# Patient Record
Sex: Female | Born: 1985 | Race: Black or African American | Hispanic: No | Marital: Single | Smoking: Current every day smoker
Health system: Southern US, Community
[De-identification: ages and names within clinical notes are randomized; demographics above are authoritative.]

## PROBLEM LIST (undated history)

## (undated) DIAGNOSIS — Z789 Other specified health status: Secondary | ICD-10-CM

## (undated) HISTORY — DX: Other specified health status: Z78.9

## (undated) HISTORY — PX: TUBAL LIGATION: SHX77

---
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
## 2010-06-22 ENCOUNTER — Ambulatory Visit: Payer: Self-pay | Admitting: Family Medicine

## 2010-10-17 ENCOUNTER — Observation Stay: Payer: Self-pay | Admitting: Obstetrics and Gynecology

## 2010-10-20 ENCOUNTER — Inpatient Hospital Stay: Payer: Self-pay | Admitting: Obstetrics and Gynecology

## 2013-04-07 ENCOUNTER — Emergency Department: Payer: Self-pay | Admitting: Internal Medicine

## 2013-04-07 LAB — URINALYSIS, COMPLETE
Bacteria: NONE SEEN
Bilirubin,UR: NEGATIVE
Ketone: NEGATIVE
Ph: 7 (ref 4.5–8.0)
Protein: NEGATIVE
RBC,UR: 9 /HPF (ref 0–5)
Specific Gravity: 1.018 (ref 1.003–1.030)
Squamous Epithelial: 18
WBC UR: 5 /HPF (ref 0–5)

## 2013-04-07 LAB — COMPREHENSIVE METABOLIC PANEL
Alkaline Phosphatase: 54 U/L (ref 50–136)
Anion Gap: 8 (ref 7–16)
BUN: 3 mg/dL — ABNORMAL LOW (ref 7–18)
Chloride: 105 mmol/L (ref 98–107)
Creatinine: 0.37 mg/dL — ABNORMAL LOW (ref 0.60–1.30)
EGFR (African American): >60
SGOT(AST): 27 U/L (ref 15–37)
SGPT (ALT): 25 U/L (ref 12–78)
Sodium: 137 mmol/L (ref 136–145)

## 2013-04-07 LAB — CBC
HCT: 30.1 % — ABNORMAL LOW (ref 35.0–47.0)
MCH: 26.8 pg (ref 26.0–34.0)
MCV: 81 fL (ref 80–100)
Platelet: 200 10*3/uL (ref 150–440)
RBC: 3.71 10*6/uL — ABNORMAL LOW (ref 3.80–5.20)
RDW: 15.2 % — ABNORMAL HIGH (ref 11.5–14.5)
WBC: 8.9 10*3/uL (ref 3.6–11.0)

## 2013-04-07 LAB — LIPASE, BLOOD: Lipase: 50 U/L — ABNORMAL LOW (ref 73–393)

## 2013-04-07 LAB — HCG, QUANTITATIVE, PREGNANCY: Beta Hcg, Quant.: 58762 m[IU]/mL — ABNORMAL HIGH

## 2013-07-18 ENCOUNTER — Ambulatory Visit: Payer: Self-pay | Admitting: Family Medicine

## 2013-08-15 ENCOUNTER — Observation Stay: Payer: Self-pay | Admitting: Obstetrics and Gynecology

## 2013-08-15 LAB — URINALYSIS, COMPLETE
Bilirubin,UR: NEGATIVE
Blood: NEGATIVE
Ketone: NEGATIVE
Nitrite: NEGATIVE
Ph: 6 (ref 4.5–8.0)
Protein: 30
RBC,UR: 2 /HPF (ref 0–5)
Specific Gravity: 1.021 (ref 1.003–1.030)
WBC UR: 6 /HPF (ref 0–5)

## 2013-09-27 ENCOUNTER — Ambulatory Visit: Payer: Self-pay | Admitting: Obstetrics and Gynecology

## 2013-09-27 LAB — CBC WITH DIFFERENTIAL/PLATELET
Basophil #: 0.1 10*3/uL (ref 0.0–0.1)
Eosinophil %: 1 %
HCT: 31.3 % — ABNORMAL LOW (ref 35.0–47.0)
HGB: 10.7 g/dL — ABNORMAL LOW (ref 12.0–16.0)
MCHC: 34.3 g/dL (ref 32.0–36.0)
Monocyte %: 6 %
Neutrophil #: 5 10*3/uL (ref 1.4–6.5)
Platelet: 209 10*3/uL (ref 150–440)
RDW: 15 % — ABNORMAL HIGH (ref 11.5–14.5)

## 2013-09-30 ENCOUNTER — Inpatient Hospital Stay: Payer: Self-pay | Admitting: Obstetrics and Gynecology

## 2013-10-01 LAB — PATHOLOGY REPORT

## 2013-10-01 LAB — HEMATOCRIT: HCT: 26.5 % — ABNORMAL LOW (ref 35.0–47.0)

## 2014-02-11 IMAGING — US US OB < 14 WEEKS
1 series · 14 of 28 positions shown · non-contrast
Comparison: none

REASON FOR EXAM: pelvic pain pregnant
COMMENTS:

PROCEDURE:     US  - US OB LESS THAN 14 WEEKS  - April 07, 2013  [DATE]
RESULT:     Comparison: None
TECHNIQUE: Multiple transabdominal gray-scale images and endovaginal
gray-scale images of the pelvis performed.

[Series 1: us ob < 14 weeks · 0.30mm/px · 14 of 35 slices shown]
[im 2/35]
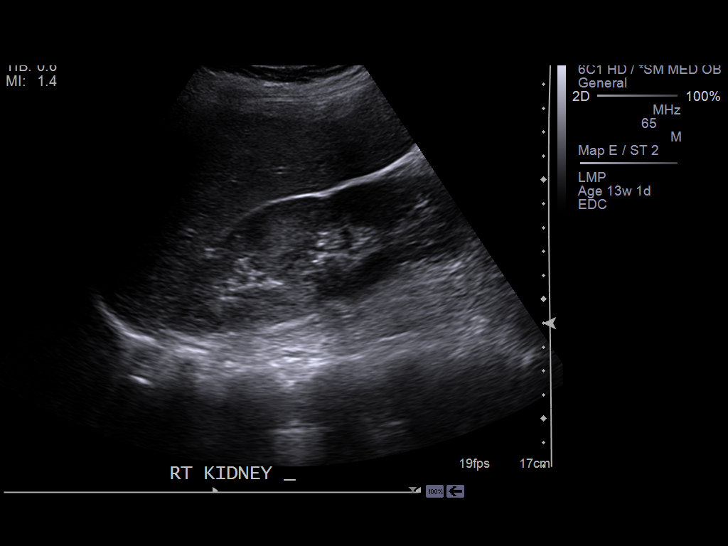
[im 4/35]
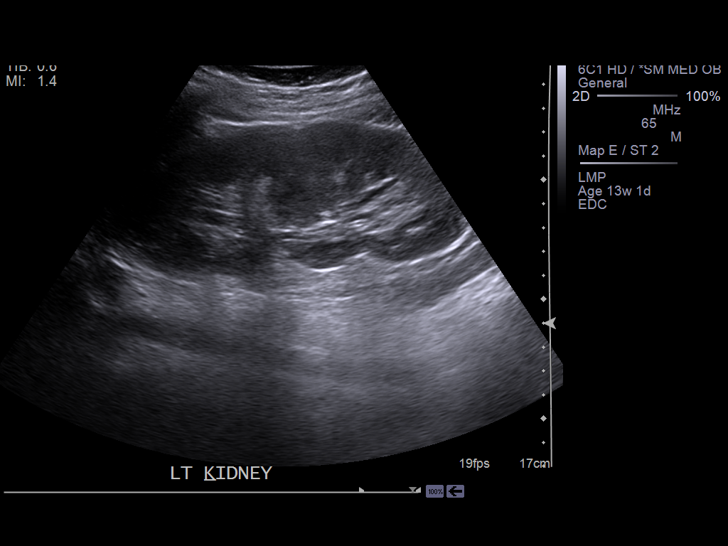
[im 7/35]
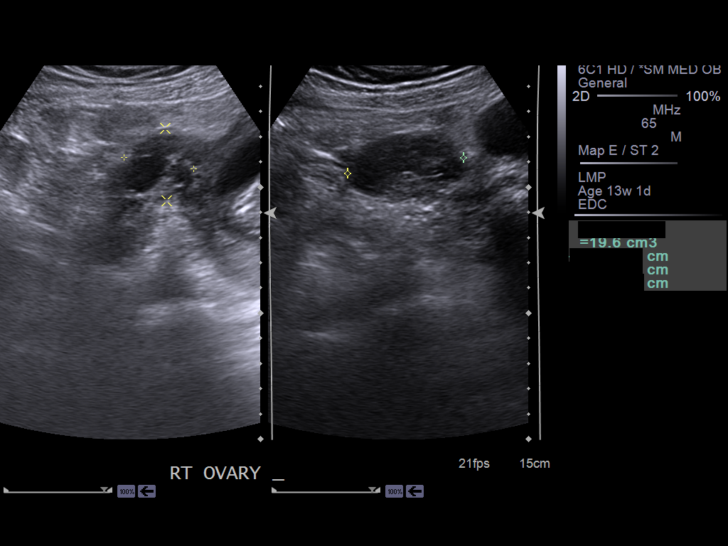
[im 9/35]
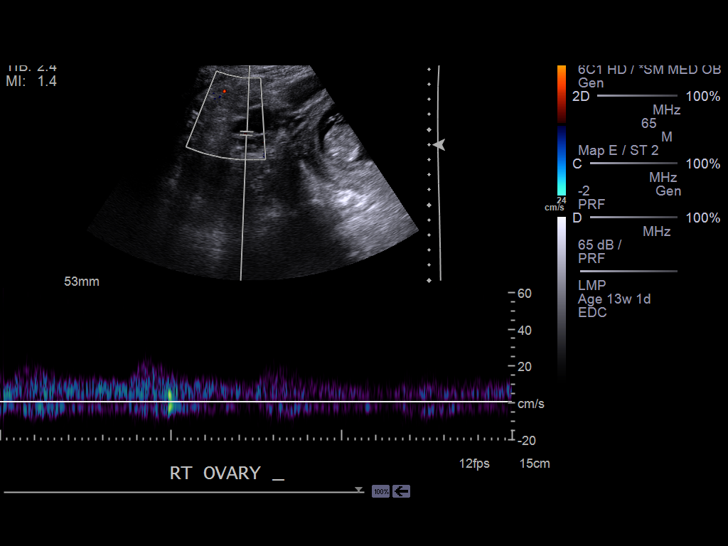
[im 12/35]
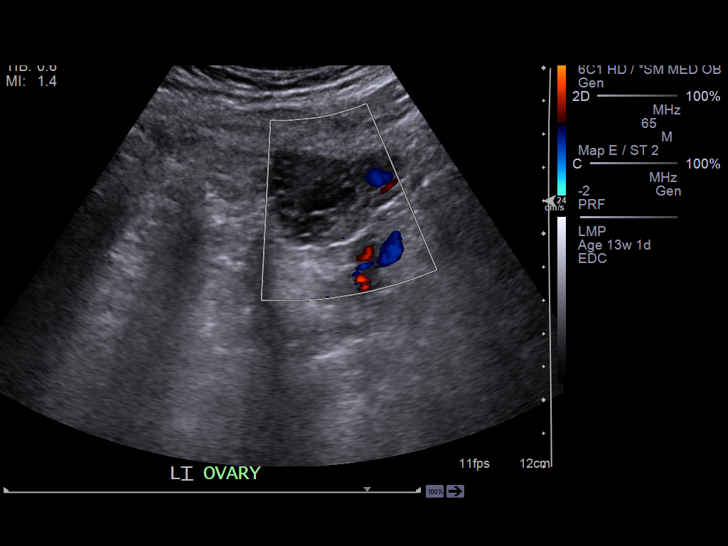
[im 14/35]
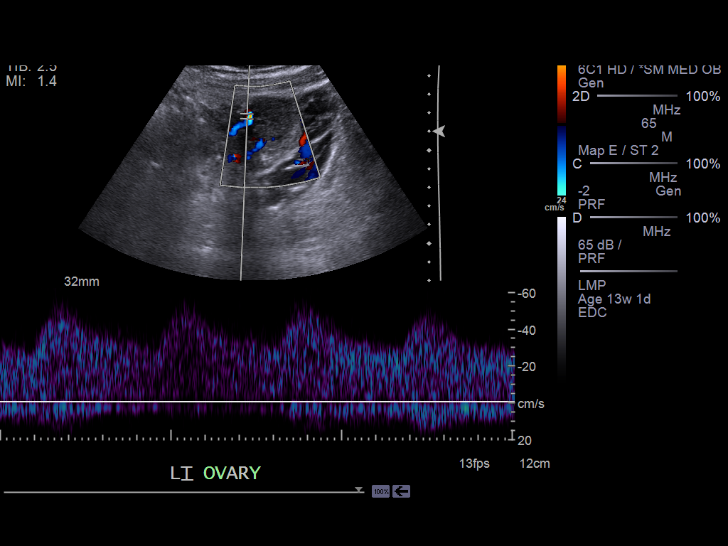
[im 17/35]
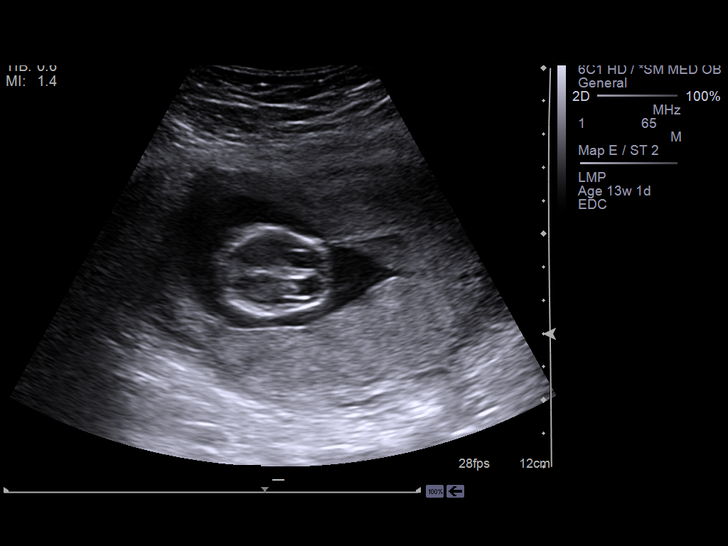
[im 19/35]
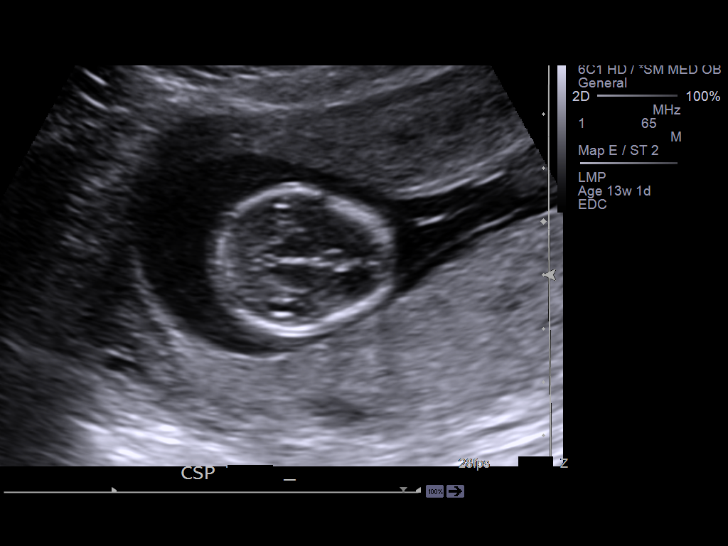
[im 22/35]
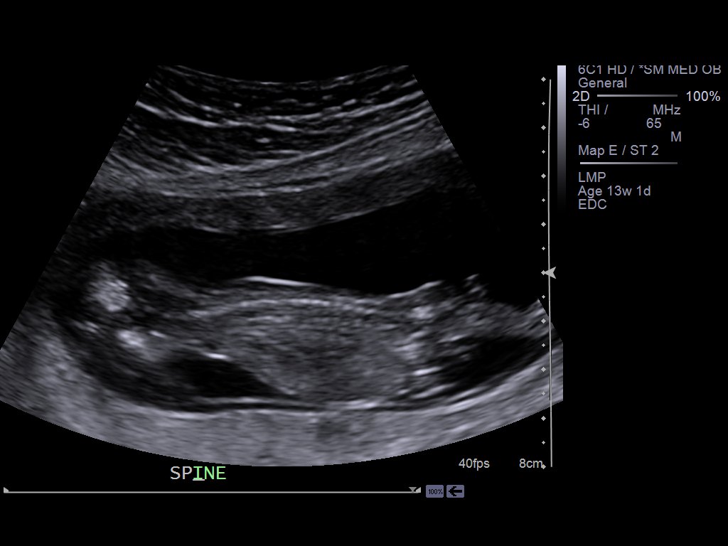
[im 24/35]
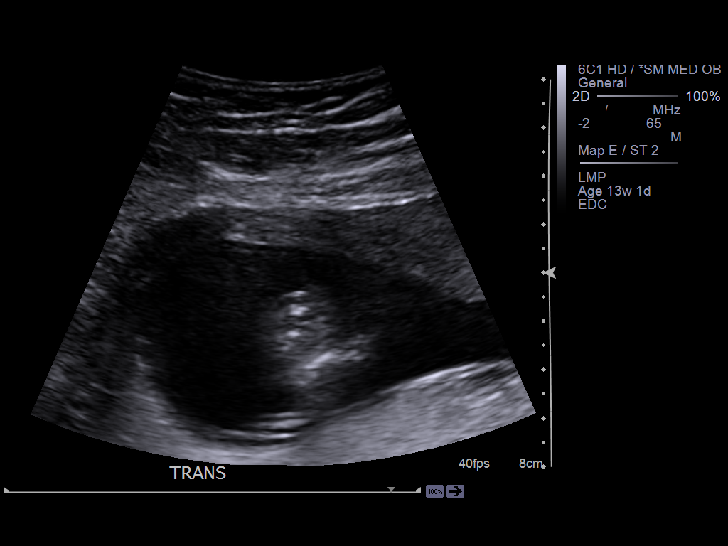
[im 27/35]
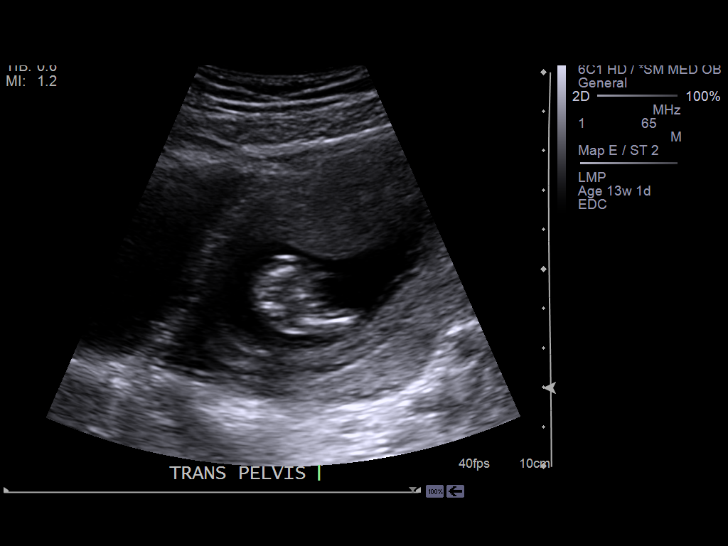
[im 29/35]
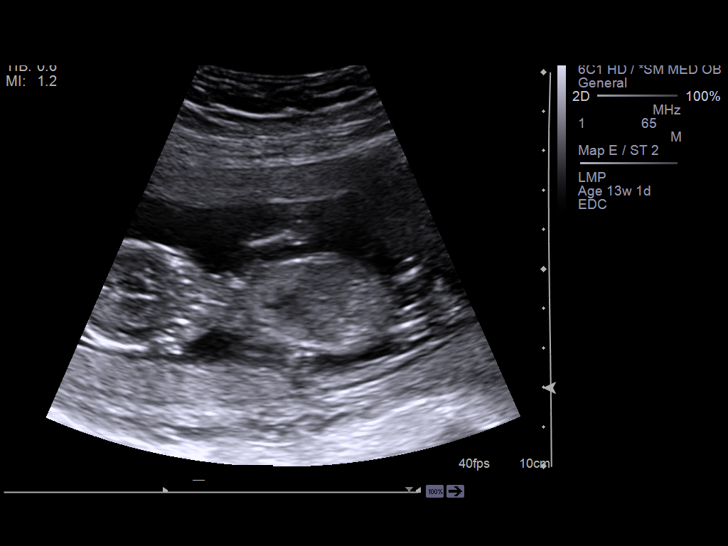
[im 32/35]
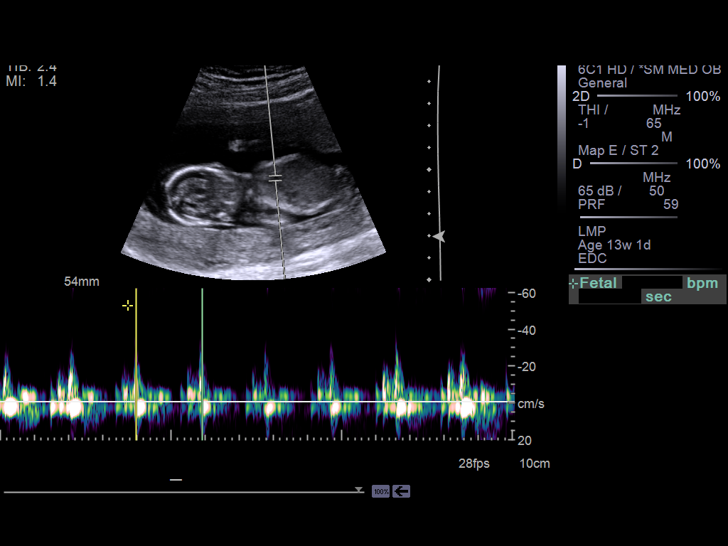
[im 35/35]
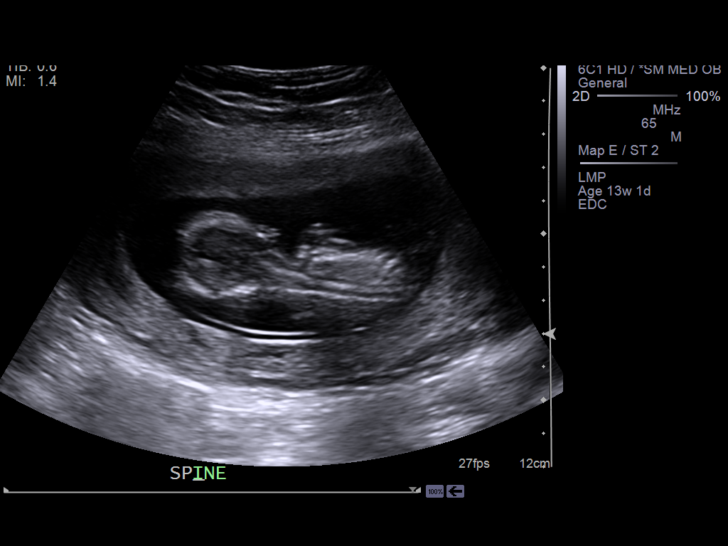

[14 of 28 positions shown; findings below may reference images not displayed]

FINDINGS: There is a single live intrauterine pregnancy visualized with a crown-rump
length of 8.14 cm dating the pregnancy at 14 weeks 1 days. There is a normal
yolk sac. There is a normal fetal heart rate of 155 beats per minute.

The right ovary measures 2.8 x 2.9 x 4.6 cm.  The left ovary measures 3.8 x
2.7 x 3.6 cm.  There is no adnexal mass.

There is no pelvic free fluid.
IMPRESSION: Single live intrauterine pregnancy dating 14 weeks 1 days with an estimated
due date of 10/03/2013.

[REDACTED]

## 2014-05-24 IMAGING — US US OB US >=[ID] SNGL FETUS
1 series · 13 of 28 positions shown · non-contrast
Comparison: none

REASON FOR EXAM: Low risk anatomy
COMMENTS:

[Series 1: us ob us >=(id) sngl fetus · 0.23mm/px · 13 of 111 slices shown]
[im 5/111]
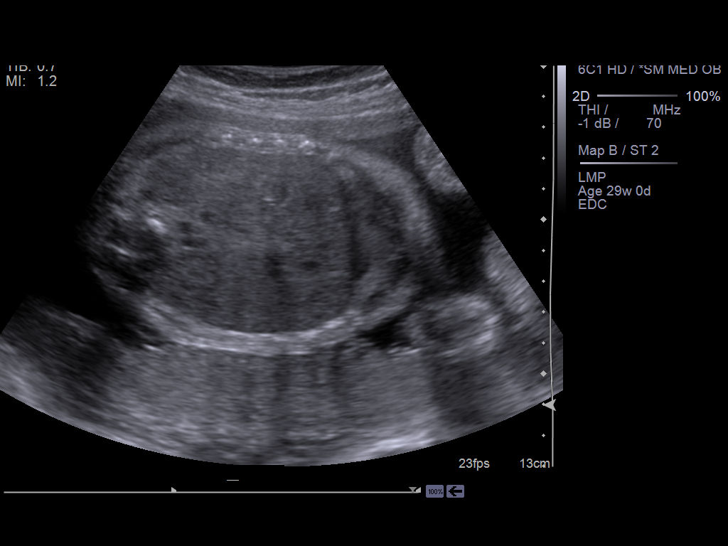
[im 13/111]
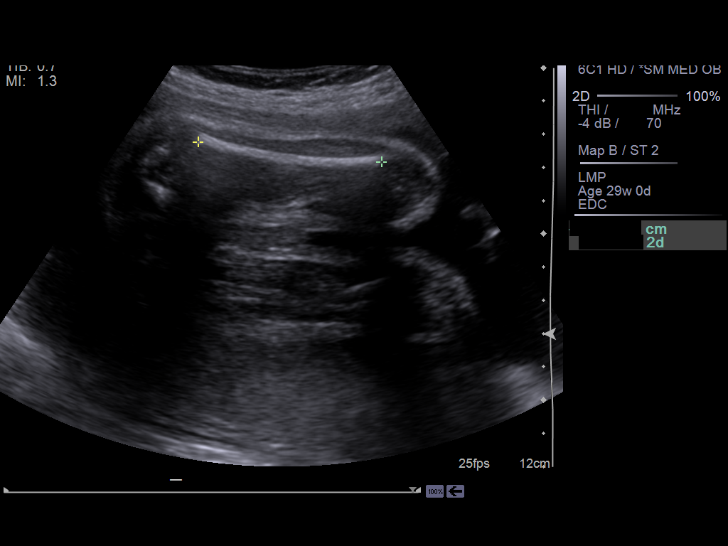
[im 21/111]
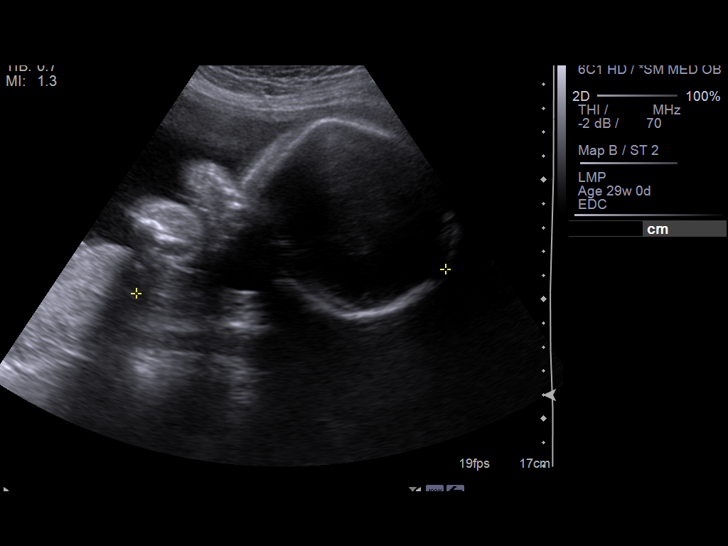
[im 29/111]
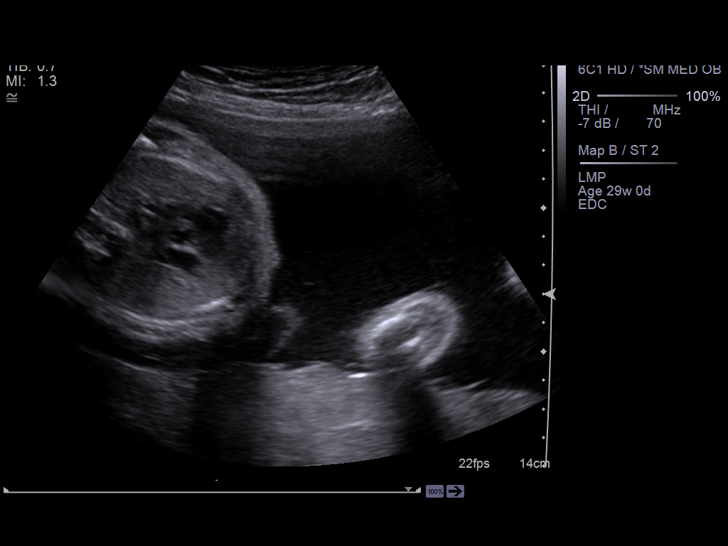
[im 37/111]
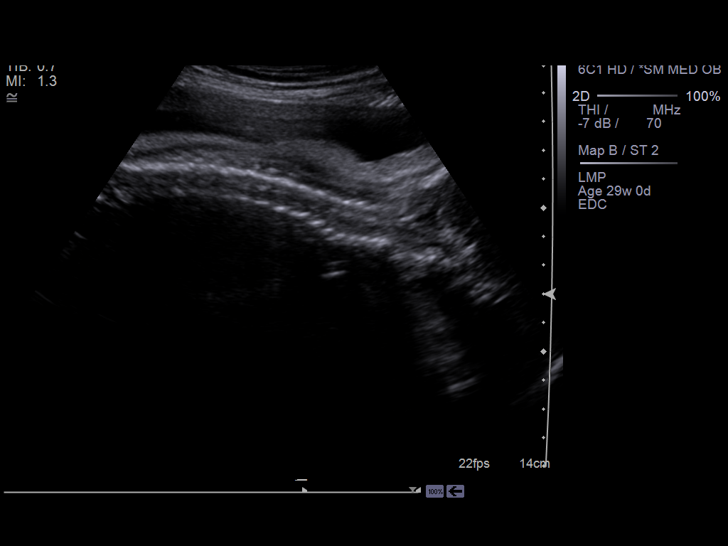
[im 45/111]
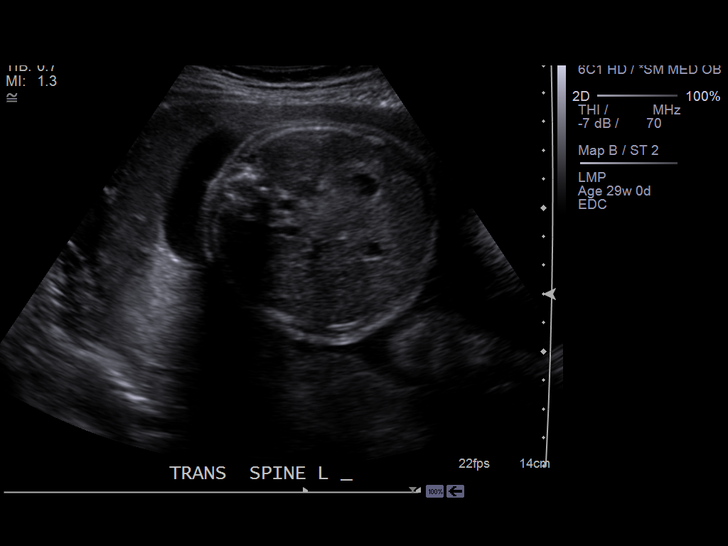
[im 58/111]
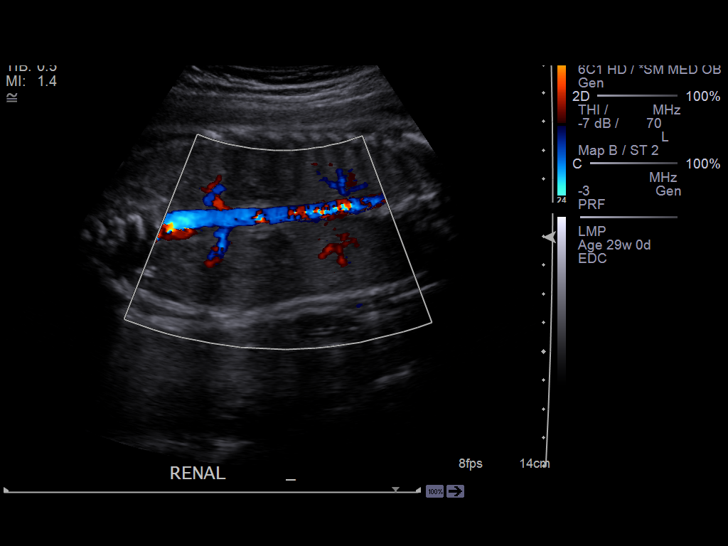
[im 66/111]
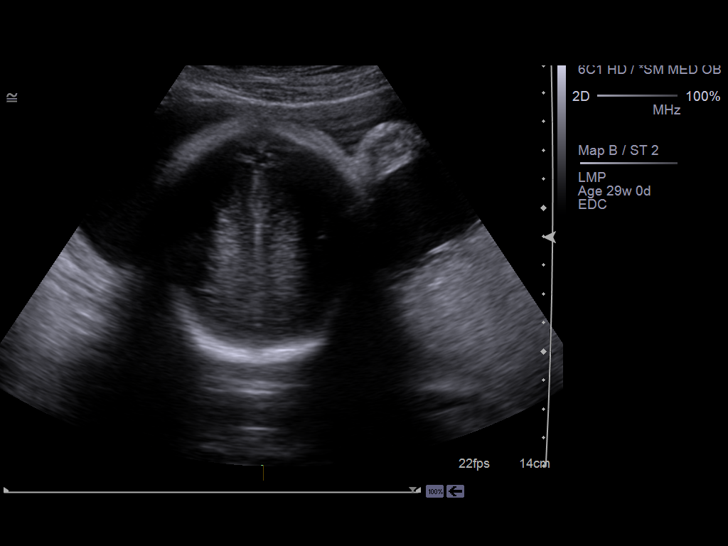
[im 74/111]
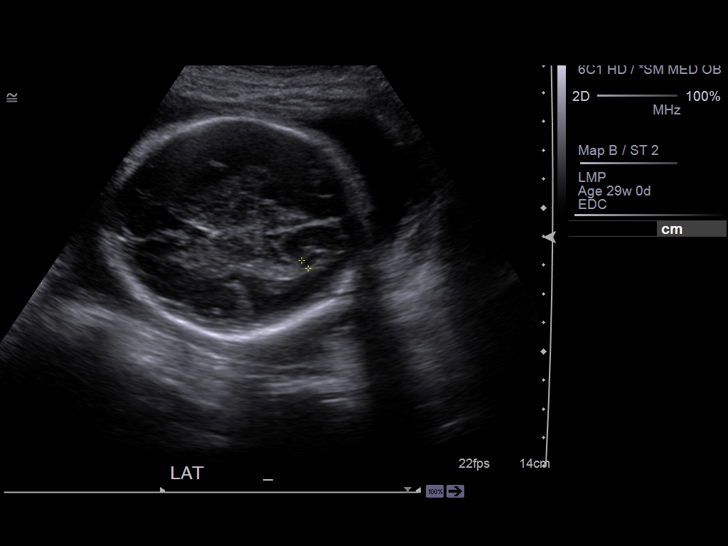
[im 82/111]
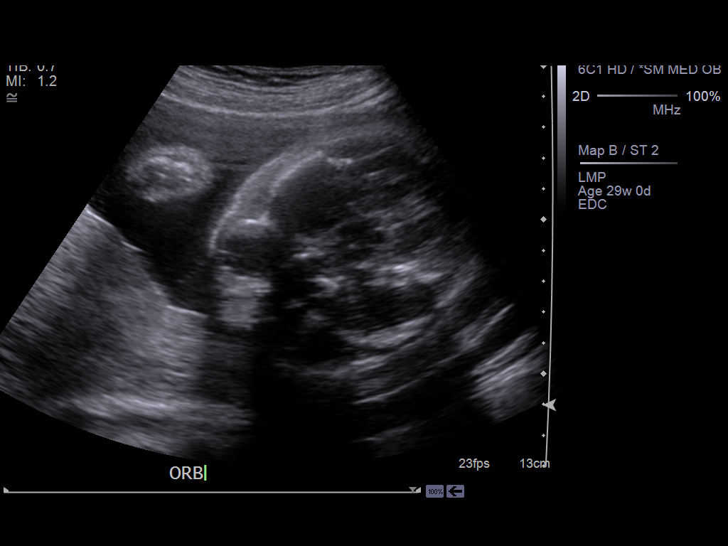
[im 90/111]
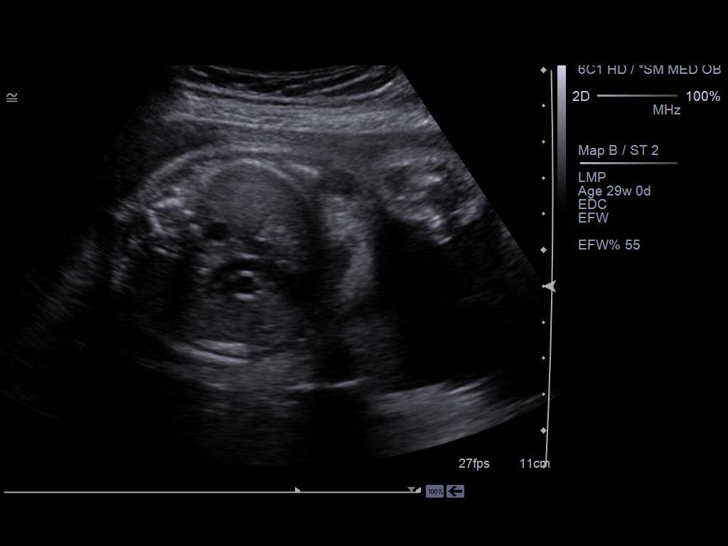
[im 98/111]
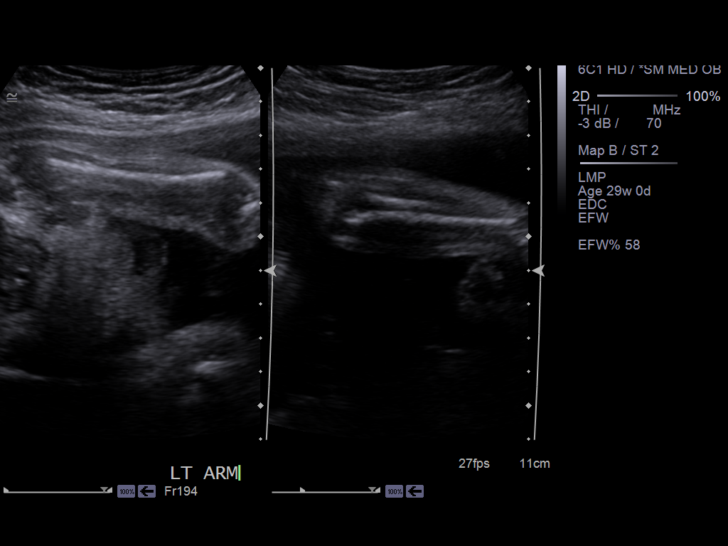
[im 106/111]
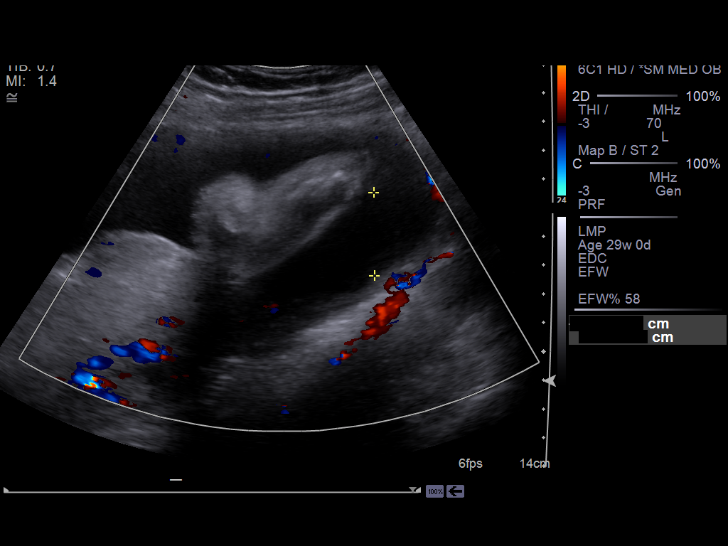

[13 of 28 positions shown; findings below may reference images not displayed]

PROCEDURE:     US  - US OB GREATER/OR EQUAL TO HA1O6  - July 18, 2013  [DATE]

RESULT:     There is a viable IUP with cephalic presentation. The placenta
is posterior. The distance from the lower placental segment to the internal
cervical os is just under 13 cm. The cervical length is 4.29 cm. There is no
evidence of placenta previa. The amniotic fluid volume is estimated to be
normal with index of 15.1 cm.

A fetal cardiac rate of 141 beats per minute is demonstrated. A 4 chambered
heart is demonstrated. The fetal stomach, kidneys, and urinary bladder are
demonstrated and appear normal. The intracranial structures, the
craniocervical junction, and the spinal structures are normal in appearance.

Measured parameters:
BPD 7.48 cm corresponding to an EGA of 30 weeks 0 days
HC 27.14 cm corresponding to an EGA of 29 weeks 4 days
AC 25.36 cm corresponding to an EGA of 29 weeks 4 days
FL 5.59 cm corresponding to an EGA of 29 weeks 3 days
HL 5.12 cm corresponding to an EGA of 29 weeks 6 days
The estimated fetal weight is 7476 grams + / - 209 grams.
IMPRESSION: There is a viable IUP with estimated gestational age of 29
weeks 5 days plus or minus approximately 14 days. The estimated date of
confinement is September 28, 2013. This is approximately 5 days earlier than
that predicted on clinical grounds. No fetal anomalies are demonstrated.

[REDACTED]

## 2014-09-10 ENCOUNTER — Emergency Department: Payer: Self-pay | Admitting: Emergency Medicine

## 2014-09-12 LAB — BETA STREP CULTURE(ARMC)

## 2015-04-03 NOTE — Op Note (Signed)
PATIENT NAME:  Sandra [name], Sandra [name] MR#:  [id number] DATE OF BIRTH:  [birth year]  DATE OF PROCEDURE:  09/30/2013  PREOPERATIVE DIAGNOSES:  1. Elective repeat cesarean section.  2. Elective permanent sterilization.   POSTOPERATIVE DIAGNOSES:   1. Elective repeat cesarean section.  2. Elective permanent sterilization.   PROCEDURE:  1. Repeat low transverse cesarean section.  2. Cicatrix removal.  3. On-Q pump placement for bilateral tubal ligation.   ANESTHESIA: Spinal.   SURGEON: Suzy Bouchardhomas J. Alante Weimann, M.D.   FIRST ASSISTANT: Brame.   INDICATIONS: This is a 29 year old gravida 3, para 2, patient with EDC of 10/03/2013 based on a 14-week ultrasound. The patient elects for repeat cesarean section and permanent sterilization.   PROCEDURE: After adequate spinal anesthesia, the patient was placed in the dorsal supine position with a hip roll under the right side. The patient was prepped and draped in normal sterile fashion. A large keloid scar was removed with sharp dissection. The fascia was then identified and opened in a transverse fashion. Superior aspect of the fascia was grasped with Coker clamps and the recti muscles were dissected free. The inferior aspect of the fascia was grasped with Kocher clamps and the pyramidalis muscle was dissected free. Entry into the peritoneal cavity was accomplished sharply. The vesicouterine peritoneal fold was identified and the bladder was reflected inferiorly. A low transverse uterine incision was made. Upon entry into the endometrial cavity, clear fluid resulted. The incision was extended with blunt transverse traction. The fetal head was brought to the incision and the vacuum was applied to the occiput. With one gentle pull of the vacuum the head was delivered. The vacuum was removed. The shoulders and body were delivered without difficulty. A vigorous female was then delivered and cord was doubly clamped. Vigorous female was passed to nursery staff who assigned  Apgar scores of 8 and 9.  The placenta was manually delivered and the uterus was exteriorized. Intravenous Pitocin was administered. The endometrial cavity was wiped clean with laparotomy tape and the cervix was opened with a ring forcep. Uterine incision was closed with 1 chromic suture in a running locking fashion with good approximation of edges. Good hemostasis was noted. Attention was then directed to the posterior cul-de-sac which was irrigated and suctioned.  Then, attention was directed to the fallopian tubes. The right fallopian tube was grasped at the midportion with a Babcock clamp. Two separate 0 plain gut sutures were applied to the fallopian tube and a 1.5 cm portion of the fallopian tube removed. Good hemostasis was noted. A similar procedure was repeated on the patient's left fallopian tube. Again, after grasping in the midportion two separate 0 plain gut sutures were placed and a 1.5 cm portion of fallopian tube was removed. Good hemostasis was noted. The uterus was placed back into the abdominal cavity and the paracolic gutters were wiped clean with laparotomy tape. The tubal ligation sites again appeared hemostatic. The uterine incision appeared hemostatic. The Interceed was then placed over the uterine incision in a T-shaped fashion without difficulty. The superior aspect of the fascia was then grasped with Coker clamps and the On-Q pump catheters were advanced from an infraumbilical position to a subfascial position. The Vicryl fascia was then closed over top the On-Q pump catheters with 0 Vicryl suture in a running nonlocking fashion with good approximation of edges. The skin edges were then picked up with Allis clamps and the subcutaneous tissues were freed from dense scar tissue so as to be able to apply  the incision more appropriately. Wound was irrigated and bovied and the skin was reapproximated with staples with good cosmetic effect. The On-Q pump catheters were secured at the skin  level and Steri-Stripped to the skin and Tegaderm was placed over this. Each catheter was loaded with 5 mL of 0.5% Marcaine.   COMPLICATIONS: None.   ESTIMATED BLOOD LOSS: 500 mL  INTRAOPERATIVE FLUIDS: 1100 mL. The patient did receive 2 grams of Ancef intravenous prior to commencement of the case.      ____________________________ Suzy Bouchard, MD tjs:sg D: 09/30/2013 08:44:26 ET T: 09/30/2013 10:47:38 ET JOB#: [id number]  cc: Suzy Bouchard, MD, <Dictator> Suzy Bouchard MD ELECTRONICALLY SIGNED 09/30/2013 22:10

## 2015-04-03 NOTE — Discharge Summary (Signed)
PATIENT NAME:  Sandra HobbyBRYANT, Bera C MR#:  [id number] DATE OF BIRTH:  August 07, 1986  DATE OF ADMISSION:  09/30/2013 DATE OF DISCHARGE:  10/03/2013  PRINCIPAL PROCEDURES:  1.  Elective repeat cesarean section. 2.  Elective permanent sterilization.  3.  On-Q pump placement.   The patient's postoperative day #1 hematocrit of 26.5%.  The hospital course was uncomplicated. The patient was discharged to home in good condition. The patient will follow-up with Dr. Feliberto GottronSchermerhorn in 2 weeks for wound care. The patient is given precautions to return to the clinic for wound drainage, fever, nausea, or vomiting.    ____________________________ Suzy Bouchardhomas J. Constantinos Krempasky, MD tjs:dp D: 10/11/2013 09:38:28 ET T: 10/11/2013 10:34:55 ET JOB#: [id number]  cc: Suzy Bouchardhomas J. Maley Venezia, MD, <Dictator> Suzy BouchardHOMAS J Jenell Dobransky MD ELECTRONICALLY SIGNED 10/16/2013 9:03

## 2016-03-11 ENCOUNTER — Emergency Department
Admission: EM | Admit: 2016-03-11 | Discharge: 2016-03-11 | Disposition: A | Discharge status: Home / Self Care | Payer: Self-pay

## 2016-03-15 ENCOUNTER — Emergency Department
Admission: EM | Admit: 2016-03-15 | Discharge: 2016-03-15 | Disposition: A | Discharge status: Home / Self Care | Payer: Self-pay | Attending: Emergency Medicine | Admitting: Emergency Medicine

## 2016-03-15 ENCOUNTER — Encounter: Payer: Self-pay | Admitting: Emergency Medicine

## 2016-03-15 DIAGNOSIS — G44209 Tension-type headache, unspecified, not intractable: Secondary | ICD-10-CM

## 2016-03-15 DIAGNOSIS — F17210 Nicotine dependence, cigarettes, uncomplicated: Secondary | ICD-10-CM | POA: Insufficient documentation

## 2016-03-15 DIAGNOSIS — H6503 Acute serous otitis media, bilateral: Secondary | ICD-10-CM | POA: Insufficient documentation

## 2016-03-15 DIAGNOSIS — J069 Acute upper respiratory infection, unspecified: Secondary | ICD-10-CM | POA: Insufficient documentation

## 2016-03-15 MED ORDER — DEXAMETHASONE SODIUM PHOSPHATE 10 MG/ML IJ SOLN
10.0000 mg | Freq: Once | INTRAMUSCULAR | Status: AC
  Administered 2016-03-15: 10 mg via INTRAMUSCULAR
  Filled 2016-03-15: qty 1

## 2016-03-15 MED ORDER — FLUTICASONE PROPIONATE 50 MCG/ACT NA SUSP: 2.0000 | Freq: Every day | NASAL | Status: DC

## 2016-03-15 MED ORDER — BENZONATATE 100 MG PO CAPS: 100.0000 mg | ORAL_CAPSULE | Freq: Three times a day (TID) | ORAL | Status: DC | PRN

## 2016-03-15 MED ORDER — CETIRIZINE-PSEUDOEPHEDRINE ER 5-120 MG PO TB12: 1.0000 | ORAL_TABLET | Freq: Two times a day (BID) | ORAL | Status: DC

## 2016-03-15 NOTE — Discharge Instructions (Signed)
Tension Headache A tension headache is pain, pressure, or aching that is felt over the front and sides of your head. These headaches can last from 30 minutes to several days. HOME CARE Managing Pain  Take over-the-counter and prescription medicines only as told by your doctor.  Lie down in a dark, quiet room when you have a headache.  If directed, apply ice to your head and neck area:  Put ice in a plastic bag.  Place a towel between your skin and the bag.  Leave the ice on for 20 minutes, 2-3 times per day.  Use a heating pad or a hot shower to apply heat to your head and neck area as told by your doctor. Eating and Drinking  Eat meals on a regular schedule.  Do not drink a lot of alcohol.  Do not use a lot of caffeine, or stop using caffeine. General Instructions  Keep all follow-up visits as told by your doctor. This is important.  Keep a journal to find out if certain things bring on headaches. For example, write down:  What you eat and drink.  How much sleep you get.  Any change to your diet or medicines.  Try getting a massage, or doing other things that help you to relax.  Lessen stress.  Sit up straight. Do not tighten (tense) your muscles.  Do not use tobacco products. This includes cigarettes, chewing tobacco, or e-cigarettes. If you need help quitting, ask your doctor.  Exercise regularly as told by your doctor.  Get enough sleep. This may mean 7-9 hours of sleep. GET HELP IF:  Your symptoms are not helped by medicine.  You have a headache that feels different from your usual headache.  You feel sick to your stomach (nauseous) or you throw up (vomit).  You have a fever. GET HELP RIGHT AWAY IF:  Your headache becomes very bad.  You keep throwing up.  You have a stiff neck.  You have trouble seeing.  You have trouble speaking.  You have pain in your eye or ear.  Your muscles are weak or you lose muscle control.  You lose your balance  or you have trouble walking.  You feel like you will pass out (faint) or you pass out.  You have confusion.   This information is not intended to replace advice given to you by your health care provider. Make sure you discuss any questions you have with your health care provider.   Document Released: 02/22/2010 Document Revised: 08/19/2015 Document Reviewed: 03/23/2015 Elsevier Interactive Patient Education 2016 [employer]   Upper Respiratory Infection, Adult Most upper respiratory infections (URIs) are caused by a virus. A URI affects the nose, throat, and upper air passages. The most common type of URI is often called "the common cold." HOME CARE   Take medicines only as told by your doctor.  Gargle warm saltwater or take cough drops to comfort your throat as told by your doctor.  Use a warm mist humidifier or inhale steam from a shower to increase air moisture. This may make it easier to breathe.  Drink enough fluid to keep your pee (urine) clear or pale yellow.  Eat soups and other clear broths.  Have a healthy diet.  Rest as needed.  Go back to work when your fever is gone or your doctor says it is okay.  You may need to stay home longer to avoid giving your URI to others.  You can also wear a face mask  and wash your hands often to prevent spread of the virus.  Use your inhaler more if you have asthma.  Do not use any tobacco products, including cigarettes, chewing tobacco, or electronic cigarettes. If you need help quitting, ask your doctor. GET HELP IF:  You are getting worse, not better.  Your symptoms are not helped by medicine.  You have chills.  You are getting more short of breath.  You have brown or red mucus.  You have yellow or brown discharge from your nose.  You have pain in your face, especially when you bend forward.  You have a fever.  You have puffy (swollen) neck glands.  You have pain while swallowing.  You have white areas in  the back of your throat. GET HELP RIGHT AWAY IF:   You have very bad or constant:  Headache.  Ear pain.  Pain in your forehead, behind your eyes, and over your cheekbones (sinus pain).  Chest pain.  You have long-lasting (chronic) lung disease and any of the following:  Wheezing.  Long-lasting cough.  Coughing up blood.  A change in your usual mucus.  You have a stiff neck.  You have changes in your:  Vision.  Hearing.  Thinking.  Mood. MAKE SURE YOU:   Understand these instructions.  Will watch your condition.  Will get help right away if you are not doing well or get worse.   This information is not intended to replace advice given to you by your health care provider. Make sure you discuss any questions you have with your health care provider.   Document Released: 05/16/2008 Document Revised: 04/14/2015 Document Reviewed: 03/05/2014 Elsevier Interactive Patient Education 2016 Elsevier Inc.  Otitis Media With Effusion Otitis media with effusion is the presence of fluid in the middle ear. This is a common problem in children, which often follows ear infections. It may be present for weeks or longer after the infection. Unlike an acute ear infection, otitis media with effusion refers only to fluid behind the ear drum and not infection. Children with repeated ear and sinus infections and allergy problems are the most likely to get otitis media with effusion. CAUSES  The most frequent cause of the fluid buildup is dysfunction of the eustachian tubes. These are the tubes that drain fluid in the ears to the back of the nose (nasopharynx). SYMPTOMS   The main symptom of this condition is hearing loss. As a result, you or your child may:  Listen to the TV at a loud volume.  Not respond to questions.  Ask "what" often when spoken to.  Mistake or confuse one sound or word for another.  There may be a sensation of fullness or pressure but usually not  pain. DIAGNOSIS   Your health care provider will diagnose this condition by examining you or your child's ears.  Your health care provider may test the pressure in you or your child's ear with a tympanometer.  A hearing test may be conducted if the problem persists. TREATMENT   Treatment depends on the duration and the effects of the effusion.  Antibiotics, decongestants, nose drops, and cortisone-type drugs (tablets or nasal spray) may not be helpful.  Children with persistent ear effusions may have delayed language or behavioral problems. Children at risk for developmental delays in hearing, learning, and speech may require referral to a specialist earlier than children not at risk.  You or your child's health care provider may suggest a referral to an ear, nose,  and throat surgeon for treatment. The following may help restore normal hearing:  Drainage of fluid.  Placement of ear tubes (tympanostomy tubes).  Removal of adenoids (adenoidectomy). HOME CARE INSTRUCTIONS   Avoid secondhand smoke.  Infants who are breastfed are less likely to have this condition.  Avoid feeding infants while they are lying flat.  Avoid known environmental allergens.  Avoid people who are sick. SEEK MEDICAL CARE IF:   Hearing is not better in 3 months.  Hearing is worse.  Ear pain.  Drainage from the ear.  Dizziness. MAKE SURE YOU:   Understand these instructions.  Will watch your condition.  Will get help right away if you are not doing well or get worse.   This information is not intended to replace advice given to you by your health care provider. Make sure you discuss any questions you have with your health care provider.   Document Released: 01/05/2005 Document Revised: 12/19/2014 Document Reviewed: 06/25/2013 Elsevier Interactive Patient Education 2016 [employer]   You appear to have symptoms associated with sinus and nasal congestion. You have fluid behind the  eardrums and nasal congestion. Your cough is likely due to post-nasal drainage. Take the prescription meds as directed. Follow-up with the health department as needed.

## 2016-03-15 NOTE — ED Notes (Signed)
Patient ambulatory to triage with steady gait, without difficulty or distress noted, mask in place; pt reports right earache since yesterday with congestion and right sided HA

## 2016-03-15 NOTE — ED Provider Notes (Signed)
CSN: [id number]     Arrival date & time 03/15/16  1942 History   First MD Initiated Contact with Patient 03/15/16 2127     Chief Complaint  Patient presents with  . Otalgia  . Headache  . Nasal Congestion   HPI  30 year old female presents to the ED for evaluation of right earache, sinus congestion, intermittent cough and some right-sided headache. Patient denies any intermittent fevers, chills, sweats. She did not receive the flu vaccine this season. She's been dosing goody powder for relief of the headache pain with some limited benefit. She denies any accident, or injury. She also denies any hearing changes or hearing loss. She describes pressure and fullness to the ears right greater than left. She describes the pain in her head and he had a 8/10 in triage.  History reviewed. No pertinent past medical history. History reviewed. No pertinent past surgical history. No family history on file. Social History  Substance Use Topics  . Smoking status: Current Every Day Smoker -- 1.00 packs/day    Types: Cigarettes  . Smokeless tobacco: None  . Alcohol Use: None   OB History    No data available     Review of Systems  Constitutional: Negative.   HENT: Positive for congestion, ear pain, postnasal drip, rhinorrhea and sore throat. Negative for ear discharge, hearing loss and tinnitus.   Eyes: Negative.   Respiratory: Positive for cough.   Gastrointestinal: Negative.   Neurological: Negative.       Allergies  Review of patient's allergies indicates no known allergies.  Home Medications   Prior to Admission medications   Medication Sig Start Date End Date Taking? Authorizing Provider  benzonatate (TESSALON PERLES) 100 MG capsule Take 1 capsule (100 mg total) by mouth 3 (three) times daily as needed for cough (Take 1-2 per dose). 03/15/16   Libni Fusaro V Bacon Quintasha Gren, PA-C  cetirizine-pseudoephedrine (ZYRTEC-D) 5-120 MG tablet Take 1 tablet by mouth 2 (two) times daily. 03/15/16   Sidonia Nutter V  Bacon Holley Wirt, PA-C  fluticasone (FLONASE) 50 MCG/ACT nasal spray Place 2 sprays into both nostrils daily. 03/15/16   Nadalee Neiswender V Bacon Korynn Kenedy, PA-C   BP 116/77 mmHg  Pulse 87  Temp(Src) 98.5 F (36.9 C) (Oral)  Resp 20  Ht 5\' 8"  (1.727 m)  Wt 81.647 kg  BMI 27.38 kg/m2  SpO2 98%  LMP 03/13/2016 (Exact Date) Physical Exam  Constitutional: She is oriented to person, place, and time. She appears well-developed and well-nourished.  HENT:  Head: Normocephalic and atraumatic.  Right Ear: External ear normal.  Left Ear: External ear normal.  Nose: Nose normal.  Mouth/Throat: Oropharynx is clear and moist.  Patient TMs are intact, however they are injected, erythematous, and serous effusions are noted bilaterally.  Eyes: Conjunctivae and EOM are normal. Pupils are equal, round, and reactive to light.  Neck: Normal range of motion. Neck supple.  Cardiovascular: Normal rate, regular rhythm and normal heart sounds.   Pulmonary/Chest: Effort normal and breath sounds normal.  Neurological: She is alert and oriented to person, place, and time.  Skin: Skin is warm and dry.  Nursing note and vitals reviewed.   ED Course  Procedures (including critical care time) Labs Review Labs Reviewed - No data to display  Imaging Review No results found.  Procedures Decadron 10 mg IM  MDM   Final diagnoses:  URI (upper respiratory infection)  Bilateral acute serous otitis media, recurrence not specified  Tension headache   Patient with a normal exam with  the exception of serous effusions bilaterally. Symptoms appear to be consistent with allergic rhinitis. She will be discharged with prescriptions for Flonase, Tessalon Perles, and Zyrtec-D. She is also provided with a Decadron injection here in the ED. Patient reports complete resolution of her headache at discharge. She will follow up with [facility] in the clinic for ongoing symptom management.    Charlesetta Ivory [place], PA-C 03/16/16 0026  Myrna Blazer, MD 03/16/16 [phone]

## 2018-07-02 ENCOUNTER — Ambulatory Visit
Admission: EM | Admit: 2018-07-02 | Discharge: 2018-07-02 | Disposition: A | Discharge status: Home / Self Care | Payer: Self-pay | Attending: Family Medicine | Admitting: Family Medicine

## 2018-07-02 DIAGNOSIS — N760 Acute vaginitis: Secondary | ICD-10-CM

## 2018-07-02 DIAGNOSIS — N898 Other specified noninflammatory disorders of vagina: Secondary | ICD-10-CM

## 2018-07-02 DIAGNOSIS — A5901 Trichomonal vulvovaginitis: Secondary | ICD-10-CM

## 2018-07-02 DIAGNOSIS — Z113 Encounter for screening for infections with a predominantly sexual mode of transmission: Secondary | ICD-10-CM

## 2018-07-02 DIAGNOSIS — B9689 Other specified bacterial agents as the cause of diseases classified elsewhere: Secondary | ICD-10-CM

## 2018-07-02 DIAGNOSIS — A5900 Urogenital trichomoniasis, unspecified: Secondary | ICD-10-CM

## 2018-07-02 DIAGNOSIS — B3731 Acute candidiasis of vulva and vagina: Secondary | ICD-10-CM

## 2018-07-02 DIAGNOSIS — B373 Candidiasis of vulva and vagina: Secondary | ICD-10-CM

## 2018-07-02 LAB — WET PREP, GENITAL: SPERM: NONE SEEN

## 2018-07-02 LAB — CHLAMYDIA/NGC RT PCR (ARMC ONLY)
CHLAMYDIA TR: NOT DETECTED
N GONORRHOEAE: NOT DETECTED

## 2018-07-02 MED ORDER — METRONIDAZOLE 500 MG PO TABS: 500.0000 mg | ORAL_TABLET | Freq: Two times a day (BID) | ORAL | 0 refills | Status: DC

## 2018-07-02 MED ORDER — FLUCONAZOLE 150 MG PO TABS: 150.0000 mg | ORAL_TABLET | Freq: Every day | ORAL | 0 refills | Status: DC

## 2018-07-02 NOTE — Discharge Instructions (Addendum)
Take medication as prescribed.  No sexual activity for at least 1 week and until follow-up.  All partners need to be tested.  Follow up with your primary care physician this week as needed. Return to Urgent care for new or worsening concerns.

## 2018-07-02 NOTE — ED Provider Notes (Signed)
[facility] ____________________________________________  Time seen: Approximately 5:12 PM  I have reviewed the triage vital signs and the nursing notes.   HISTORY  Chief Complaint Vaginitis  HPI Sandra [name] is a 32 y.o. female presenting for evaluation of vaginal itching and vaginal discharge present for last 2 days.  States vaginal discharge is clearish to whitish, and denies known odor.  Denies any urinary frequency, urinary urgency, burning with urination.  Denies any pelvic pain, abdominal pain, back pain, nausea, vomiting, diarrhea or fevers.  Reports continues to eat and drink well.  No over-the-counter medications taken for the same complaints.  States feels similar to previous yeast infections.  Also does express concern of STDs.  Denies other aggravating or alleviating factors.  Reports sexually active with the same partner.  Denies recent changes.  Denies current pregnancy.  Denies other complaints. Denies recent sickness. Denies recent antibiotic use.    History reviewed. No pertinent past medical history.  There are no active problems to display for this patient.   History reviewed. No pertinent surgical history.   No current facility-administered medications for this encounter.   Current Outpatient Medications:  .  benzonatate (TESSALON PERLES) 100 MG capsule, Take 1 capsule (100 mg total) by mouth 3 (three) times daily as needed for cough (Take 1-2 per dose)., Disp: 30 capsule, Rfl: 0 .  cetirizine-pseudoephedrine (ZYRTEC-D) 5-120 MG tablet, Take 1 tablet by mouth 2 (two) times daily., Disp: 30 tablet, Rfl: 0 .  fluconazole (DIFLUCAN) 150 MG tablet, Take 1 tablet (150 mg total) by mouth daily. Take one pill orally, then Repeat in one week, Disp: 2 tablet, Rfl: 0 .  fluticasone (FLONASE) 50 MCG/ACT nasal spray, Place 2 sprays into both nostrils daily., Disp: 16 g, Rfl: 0 .  metroNIDAZOLE (FLAGYL) 500 MG tablet, Take 1 tablet (500 mg total) by mouth 2  (two) times daily., Disp: 14 tablet, Rfl: 0  Allergies Patient has no known allergies.  No family history on file.  Social History Social History   Tobacco Use  . Smoking status: Current Every Day Smoker    Packs/day: 1.00    Types: Cigarettes  . Smokeless tobacco: Never Used  Substance Use Topics  . Alcohol use: Not on file  . Drug use: Not on file    Review of Systems Constitutional: No fever/chills Cardiovascular: Denies chest pain. Respiratory: Denies shortness of breath. Gastrointestinal: No abdominal pain.  No nausea, no vomiting.  No diarrhea.  Genitourinary: Negative for dysuria. As above.  Musculoskeletal: Negative for back pain. Skin: Negative for rash.   ____________________________________________   PHYSICAL EXAM:  VITAL SIGNS: ED Triage Vitals  Enc Vitals Group     BP 07/02/18 1516 119/77     Pulse Rate 07/02/18 1516 79     Resp 07/02/18 1516 18     Temp 07/02/18 1516 98.4 F (36.9 C)     Temp Source 07/02/18 1516 Oral     SpO2 07/02/18 1516 97 %     Weight 07/02/18 1518 197 lb (89.4 kg)     Height --      Head Circumference --      Peak Flow --      Pain Score 07/02/18 1518 7     Pain Loc --      Pain Edu? --      Excl. in GC? --     Constitutional: Alert and oriented. Well appearing and in no acute distress. ENT      Head: Normocephalic  and atraumatic. Cardiovascular: Normal rate, regular rhythm. Grossly normal heart sounds.  Good peripheral circulation. Respiratory: Normal respiratory effort without tachypnea nor retractions. Breath sounds are clear and equal bilaterally. No wheezes, rales, rhonchi. Gastrointestinal: Soft and nontender. No CVA tenderness. Musculoskeletal: No midline cervical, thoracic or lumbar tenderness to palpation. Pelvic: exam completed with Janee CMA at bedside External: no rash, mild vulvar erythema. Speculum: mild to moderate whitish vaginal discharge, no bleeding, cervical os closed.  Neurologic:  Normal speech  and language.Speech is normal. No gait instability.  Skin:  Skin is warm, dry and intact. No rash noted. Psychiatric: Mood and affect are normal. Speech and behavior are normal. Patient exhibits appropriate insight and judgment   ___________________________________________   LABS (all labs ordered are listed, but only abnormal results are displayed)  Labs Reviewed  WET PREP, GENITAL - Abnormal; Notable for the following components:      Result Value   Yeast Wet Prep HPF POC PRESENT (*)    Trich, Wet Prep PRESENT (*)    Clue Cells Wet Prep HPF POC PRESENT (*)    WBC, Wet Prep HPF POC MANY (*)    All other components within normal limits  CHLAMYDIA/NGC RT PCR ([facility] ONLY)    PROCEDURES Procedures   INITIAL IMPRESSION / ASSESSMENT AND PLAN / ED COURSE  Pertinent labs & imaging results that were available during my care of the patient were reviewed by me and considered in my medical decision making (see chart for details).  Well-appearing patient.  No acute distress.  Presented for evaluation of vaginal discharge.  Pelvic exam completed.  Awaiting following chlamydia.  Wet prep positive for trichomonas, clue cells and yeast.  Will treat with Flagyl twice a day for 1 week as well as Diflucan 2 doses.  Discussed informing partner and partner needs to be treated.  No sexual activity for at least 1 week and recommend follow-up.  Patient elected only to have gonorrhea chlamydia and wet prep completed.  Patient declined further STD testing.  Again discussed with patient further STD testing, patient again declined and states that she will [facility] and possibly have further STD testing.Discussed indication, risks and benefits of medications with patient.  Discussed follow up with Primary care physician this week. Discussed follow up and return parameters including no resolution or any worsening concerns. Patient verbalized understanding and agreed to plan.     ____________________________________________   FINAL CLINICAL IMPRESSION(S) / ED DIAGNOSES  Final diagnoses:  Bacterial vaginosis  Yeast vaginitis  Trichomonas vaginitis     ED Discharge Orders        Ordered    metroNIDAZOLE (FLAGYL) 500 MG tablet  2 times daily     07/02/18 1631    fluconazole (DIFLUCAN) 150 MG tablet  Daily     07/02/18 1631       Note: This dictation was prepared with Dragon dictation along with smaller phrase technology. Any transcriptional errors that result from this process are unintentional.         Renford DillsMiller, Sharra Cayabyab, NP 07/02/18 1814

## 2018-07-02 NOTE — ED Triage Notes (Signed)
Pt here for vaginal itching for 2 days, clear discharge and no urinary symptoms. York SpanielSaid it is a chance of Stds.

## 2018-10-31 LAB — HM HIV SCREENING LAB: HM HIV Screening: NEGATIVE

## 2020-06-08 ENCOUNTER — Encounter: Payer: Self-pay | Admitting: Advanced Practice Midwife

## 2020-06-08 ENCOUNTER — Ambulatory Visit: Payer: Self-pay | Admitting: Advanced Practice Midwife

## 2020-06-08 ENCOUNTER — Other Ambulatory Visit: Payer: Self-pay

## 2020-06-08 DIAGNOSIS — Z9851 Tubal ligation status: Secondary | ICD-10-CM

## 2020-06-08 DIAGNOSIS — A599 Trichomoniasis, unspecified: Secondary | ICD-10-CM

## 2020-06-08 DIAGNOSIS — F17200 Nicotine dependence, unspecified, uncomplicated: Secondary | ICD-10-CM

## 2020-06-08 DIAGNOSIS — Z113 Encounter for screening for infections with a predominantly sexual mode of transmission: Secondary | ICD-10-CM

## 2020-06-08 LAB — WET PREP FOR TRICH, YEAST, CLUE
Trichomonas Exam: POSITIVE — AB
Yeast Exam: NEGATIVE

## 2020-06-08 MED ORDER — METRONIDAZOLE 500 MG PO TABS: 2000.0000 mg | ORAL_TABLET | Freq: Once | ORAL | 0 refills | Status: AC

## 2020-06-08 NOTE — Progress Notes (Signed)
[facility] Department STI clinic/screening visit  Subjective:  Sandra [name] is a 34 y.o. SBF G3P3 smoker female being seen today for an STI screening visit. The patient reports they do have symptoms.  Patient reports that they do not desire a pregnancy in the next year.   They reported they are not interested in discussing contraception today.  No LMP recorded (approximate).   Patient has the following medical conditions:   Patient Active Problem List   Diagnosis Date Noted  . Morbid obesity (HCC) 06/08/2020  . Smoker1 ppd 06/08/2020  . History of tubal ligation 2014 06/08/2020    No chief complaint on file.   HPI  Patient reports vaginal irritation x 5 days.  Last sex 05/23/20 without condom.  LMP 05/26/20.  Last MJ 13 years ago.  Last ETOH last night (2 shots liquor) on "holidays only".  BTL 2014.  Smoking 1 ppd  Last HIV test per patient/review of record was 10/31/18 Patient reports last pap was 2014  See flowsheet for further details and programmatic requirements.    The following portions of the patient's history were reviewed and updated as appropriate: allergies, current medications, past medical history, past social history, past surgical history and problem list.  Objective:  There were no vitals filed for this visit.  Physical Exam Vitals and nursing note reviewed.  Constitutional:      Appearance: Normal appearance. She is obese.  HENT:     Head: Normocephalic and atraumatic.     Mouth/Throat:     Mouth: Mucous membranes are moist.     Pharynx: Oropharynx is clear. No oropharyngeal exudate or posterior oropharyngeal erythema.  Eyes:     Conjunctiva/sclera: Conjunctivae normal.  Pulmonary:     Effort: Pulmonary effort is normal.  Abdominal:     Palpations: Abdomen is soft. There is no mass.     Tenderness: There is no abdominal tenderness. There is no rebound.     Comments: Fair tone, soft without tenderness  Genitourinary:    Exam  position: Lithotomy position.     Pubic Area: No rash or pubic lice.      Labia:        Right: No rash or lesion.        Left: No rash or lesion.      Vagina: Vaginal discharge (white creamy leukorrhea, ph>4.5) present. No erythema, bleeding or lesions.     Cervix: Normal.     Uterus: Normal.      Adnexa: Right adnexa normal and left adnexa normal.     Rectum: Normal.     Comments: Bilateral labia hypopigmented, dry Lymphadenopathy:     Head:     Right side of head: No preauricular or posterior auricular adenopathy.     Left side of head: No preauricular or posterior auricular adenopathy.     Cervical: No cervical adenopathy.     Upper Body:     Right upper body: No supraclavicular or axillary adenopathy.     Left upper body: No supraclavicular or axillary adenopathy.     Lower Body: No right inguinal adenopathy. No left inguinal adenopathy.  Skin:    General: Skin is warm and dry.     Findings: No rash.  Neurological:     Mental Status: She is alert and oriented to person, place, and time.      Assessment and Plan:  Sandra [name] is a 34 y.o. female presenting to the [facility] Department for STI screening  1. Morbid obesity (HCC)   2. Screening examination for venereal disease Treat wet mount per standing orders Immunization nurse consult Please give pt primary care MD list - WET PREP FOR TRICH, YEAST, CLUE - HIV [facility] - Syphilis Serology, [facility] - Chlamydia/Gonorrhea  Lab  3. Smoker1 ppd Counseled via 5 A's to stop smoking  4. History of tubal ligation 2014      Return if symptoms worsen or fail to improve.  No future appointments.  Alberteen Spindle, CNM

## 2020-06-08 NOTE — Progress Notes (Signed)
Wet mount reviewed and pt treated for Trich per standing order and per E. Sciora, CNM verbal order. Counseled pt per provider orders and pt states understanding. Provider orders completed. 

## 2020-07-17 ENCOUNTER — Other Ambulatory Visit: Payer: Self-pay

## 2023-11-12 ENCOUNTER — Other Ambulatory Visit: Payer: Self-pay

## 2023-11-12 ENCOUNTER — Emergency Department
Admission: EM | Admit: 2023-11-12 | Discharge: 2023-11-12 | Disposition: A | Discharge status: Home / Self Care | Payer: 59 | Attending: Emergency Medicine | Admitting: Emergency Medicine

## 2023-11-12 DIAGNOSIS — Y92410 Unspecified street and highway as the place of occurrence of the external cause: Secondary | ICD-10-CM | POA: Diagnosis not present

## 2023-11-12 DIAGNOSIS — M545 Low back pain, unspecified: Secondary | ICD-10-CM | POA: Insufficient documentation

## 2023-11-12 DIAGNOSIS — F17210 Nicotine dependence, cigarettes, uncomplicated: Secondary | ICD-10-CM | POA: Diagnosis not present

## 2023-11-12 MED ORDER — LIDOCAINE 5 % EX PTCH
1.0000 | MEDICATED_PATCH | CUTANEOUS | Status: DC
  Administered 2023-11-12: 1 via TRANSDERMAL
  Filled 2023-11-12: qty 1

## 2023-11-12 MED ORDER — LIDOCAINE 5 % EX PTCH: 1.0000 | MEDICATED_PATCH | Freq: Two times a day (BID) | CUTANEOUS | 0 refills | Status: AC

## 2023-11-12 MED ORDER — KETOROLAC TROMETHAMINE 15 MG/ML IJ SOLN
15.0000 mg | Freq: Once | INTRAMUSCULAR | Status: AC
  Administered 2023-11-12: 15 mg via INTRAMUSCULAR
  Filled 2023-11-12: qty 1
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
## 2023-11-12 NOTE — Discharge Instructions (Addendum)
Please return to the emergency department for any new, worsening, or changing symptoms or other concerns including weakness in your legs, urinary or stool incontinence or retention, numbness or tingling in your extremities/buttocks/groin, fevers, or any other concerns or change in symptoms. Please follow-up with your outpatient provider.  You may continue to take Tylenol/ibuprofen for effect instructions to help with your symptoms.  Please return for any new, worsening, or change in symptoms or other concerns.  It was a pleasure caring for you today.
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
## 2024-03-31 ENCOUNTER — Other Ambulatory Visit: Payer: Self-pay

## 2024-03-31 ENCOUNTER — Emergency Department: Admission: EM | Admit: 2024-03-31 | Discharge: 2024-03-31 | Disposition: A | Discharge status: Home / Self Care

## 2024-03-31 DIAGNOSIS — N760 Acute vaginitis: Secondary | ICD-10-CM | POA: Diagnosis not present

## 2024-03-31 DIAGNOSIS — L292 Pruritus vulvae: Secondary | ICD-10-CM | POA: Diagnosis present

## 2024-03-31 DIAGNOSIS — N3001 Acute cystitis with hematuria: Secondary | ICD-10-CM | POA: Insufficient documentation

## 2024-03-31 DIAGNOSIS — B9689 Other specified bacterial agents as the cause of diseases classified elsewhere: Secondary | ICD-10-CM | POA: Insufficient documentation

## 2024-03-31 DIAGNOSIS — Z113 Encounter for screening for infections with a predominantly sexual mode of transmission: Secondary | ICD-10-CM | POA: Insufficient documentation

## 2024-03-31 LAB — URINALYSIS, ROUTINE W REFLEX MICROSCOPIC
Bilirubin Urine: NEGATIVE
Glucose, UA: NEGATIVE mg/dL
Ketones, ur: NEGATIVE mg/dL
Nitrite: NEGATIVE
Protein, ur: 30 mg/dL — AB
Specific Gravity, Urine: 1.023 (ref 1.005–1.030)
Squamous Epithelial / HPF: >50 /HPF (ref 0–5)
pH: 5 (ref 5.0–8.0)

## 2024-03-31 LAB — POC URINE PREG, ED: Preg Test, Ur: NEGATIVE

## 2024-03-31 LAB — WET PREP, GENITAL
Sperm: NONE SEEN
Trich, Wet Prep: NONE SEEN
WBC, Wet Prep HPF POC: <10 (ref <10)
Yeast Wet Prep HPF POC: NONE SEEN

## 2024-03-31 LAB — CHLAMYDIA/NGC RT PCR (ARMC ONLY)
Chlamydia Tr: NOT DETECTED
N gonorrhoeae: NOT DETECTED

## 2024-03-31 MED ORDER — CEPHALEXIN 500 MG PO CAPS: 500.0000 mg | ORAL_CAPSULE | Freq: Two times a day (BID) | ORAL | 0 refills | Status: AC

## 2024-03-31 MED ORDER — METRONIDAZOLE 500 MG PO TABS: 500.0000 mg | ORAL_TABLET | Freq: Two times a day (BID) | ORAL | 0 refills | Status: AC

## 2024-03-31 NOTE — ED Provider Notes (Signed)
 [facility] Emergency Department Provider Note     Event Date/Time   First MD Initiated Contact with Patient 03/31/24 1452     (approximate)   History   Vaginal Itching   HPI  Sandra [name] is a 38 y.o. female with no significant past medical history presents to the ED for for evaluation of vaginal itching x 4 days. Denies bleeding or dysuria. Denies vaginal discharge. Endorse being sexually active with no condom use. No abdominal pain. No fevers or vomiting.     Physical Exam   Triage Vital Signs: ED Triage Vitals  Encounter Vitals Group     BP 03/31/24 1446 119/76     Systolic BP Percentile --      Diastolic BP Percentile --      Pulse Rate 03/31/24 1446 94     Resp 03/31/24 1446 20     Temp 03/31/24 1446 98.7 F (37.1 C)     Temp Source 03/31/24 1446 Oral     SpO2 03/31/24 1446 98 %     Weight 03/31/24 1445 207 lb (93.9 kg)     Height 03/31/24 1445 5\' 8"  (1.727 m)     Head Circumference --      Peak Flow --      Pain Score 03/31/24 1444 0     Pain Loc --      Pain Education --      Exclude from Growth Chart --     Most recent vital signs: Vitals:   03/31/24 1446 03/31/24 1709  BP: 119/76 118/84  Pulse: 94 86  Resp: 20 17  Temp: 98.7 F (37.1 C) 98.2 F (36.8 C)  SpO2: 98% 98%    General Awake, no distress.  HEENT NCAT.  CV:  Good peripheral perfusion.  RESP:  Normal effort.  ABD:  No distention.  Other:  Pelvic exam performed.  Moderate white discharge in vaginal vault.  Cervix is visualized and normal-appearing.  No vaginal lesions.  No cervical motion tenderness.  ED Results / Procedures / Treatments   Labs (all labs ordered are listed, but only abnormal results are displayed) Labs Reviewed  WET PREP, GENITAL - Abnormal; Notable for the following components:      Result Value   Clue Cells Wet Prep HPF POC PRESENT (*)    All other components within normal limits  URINALYSIS, ROUTINE W REFLEX MICROSCOPIC -  Abnormal; Notable for the following components:   Color, Urine YELLOW (*)    APPearance CLOUDY (*)    Hgb urine dipstick MODERATE (*)    Protein, ur 30 (*)    Leukocytes,Ua LARGE (*)    Bacteria, UA MANY (*)    All other components within normal limits  CHLAMYDIA/NGC RT PCR ([facility] ONLY)            POC URINE PREG, ED   No results found.  PROCEDURES:  Critical Care performed: No  Procedures   MEDICATIONS ORDERED IN ED: Medications - No data to display   IMPRESSION / MDM / ASSESSMENT AND PLAN / ED COURSE  I reviewed the triage vital signs and the nursing notes.                               38 y.o. female presents to the emergency department for evaluation and treatment of vaginal itching. See HPI for further details.   Differential diagnosis includes, but is not limited  to UTI, vaginitis, STD/STI  Patient's presentation is most consistent with acute complicated illness / injury requiring diagnostic workup.  Patient is alert and oriented.  She is hemodynamic stable and afebrile.  Physical exam is as stated above.  No indication for imaging.  Urinalysis revealed moderate Hgb and large leukocytes indicating urinary tract infection.  Wet prep is positive for clue cells indicating bacterial vaginosis.  Reassuring STD screening.  Negative pregnancy test.  Will treat with Keflex  and Flagyl .  Encouraged to follow-up with primary care.  Patient stable condition for discharge home.  ED return precautions discussed.  FINAL CLINICAL IMPRESSION(S) / ED DIAGNOSES   Final diagnoses:  Acute cystitis with hematuria  BV (bacterial vaginosis)  Screening examination for STD (sexually transmitted disease)    Rx / DC Orders   ED Discharge Orders          Ordered    metroNIDAZOLE  (FLAGYL ) 500 MG tablet  2 times daily        03/31/24 1652    cephALEXin  (KEFLEX ) 500 MG capsule  2 times daily        03/31/24 1652            Note:  This document was prepared using Dragon voice  recognition software and may include unintentional dictation errors.    Phyllis Breeze, Olusegun Gerstenberger A, PA-C 03/31/24 2004    Collis Deaner, MD 04/01/24 [phone]

## 2024-03-31 NOTE — Discharge Instructions (Addendum)
 Your analysis reveals a urinary tract infection which we will treat with antibiotics.  Please take this antibiotic as prescribed and until dose is complete.  Your wet prep revealed positive for BV in which we have sent another antibiotic to take care of this.  Please review bacterial vaginosis patient education packet attached to your discharge papers.  Your chlamydia and gonorrhea test is pending.  If these return positive you will receive a phone call with the appropriate medication.  Please follow-up with your primary care or the health department for evaluation as needed.  If symptoms worsen please feel welcome to return to the ED for further evaluation.

## 2024-03-31 NOTE — ED Triage Notes (Signed)
 Pt to ED for vulvar and vaginal itching since yesterday. Denies urinary symptoms. Would like STI testing.

## 2025-01-01 ENCOUNTER — Ambulatory Visit: Payer: Self-pay

## 2025-01-01 DIAGNOSIS — Z113 Encounter for screening for infections with a predominantly sexual mode of transmission: Secondary | ICD-10-CM

## 2025-01-01 DIAGNOSIS — A599 Trichomoniasis, unspecified: Secondary | ICD-10-CM

## 2025-01-01 LAB — WET PREP FOR TRICH, YEAST, CLUE
Clue Cell Exam: NEGATIVE
Trichomonas Exam: POSITIVE — AB
Yeast Exam: NEGATIVE

## 2025-01-01 LAB — HM HIV SCREENING LAB: HM HIV Screening: NEGATIVE

## 2025-01-01 MED ORDER — METRONIDAZOLE 500 MG PO TABS: 500.0000 mg | ORAL_TABLET | Freq: Two times a day (BID) | ORAL | Status: AC

## 2025-01-01 NOTE — Progress Notes (Signed)
 " Sandra [name] [address] [place] [address] Main phone: [phone]  STI screening visit  Subjective:  Sandra [name] is a 39 y.o. female being seen today for an STI screening visit. The patient reports they do have symptoms.    Patient has the following medical conditions:  Patient Active Problem List   Diagnosis Date Noted   Morbid obesity (HCC) 06/08/2020   Smoker1 ppd 06/08/2020   History of tubal ligation 2014 06/08/2020   Trichomonas infection 06/08/20 06/08/2020   Chief Complaint  Patient presents with   SEXUALLY TRANSMITTED DISEASE   HPI Patient reports itching, odor and discharge since about a week ago, and since then found out she was exposed to trichomonas.  Reproductive considerations Patient reports they are not pregnant  and not breastfeeding. They do not desire a pregnancy in the next year. Patient is currently using female sterilization to prevent pregnancy.  Patient's last menstrual period was 12/23/2024 (exact date).  Patient's routine cervical screening is scheduled for Friday.  See flowsheet for further details and programmatic requirements Hyperlink available at the top of the signed note in blue.  Flow sheet content below:  Pregnancy Intention Screening Does the patient want to become pregnant in the next year?: No Does the patient's partner want to become pregnant in the next year?: N/A Would the patient like to discuss contraceptive options today?: No Reason For STD Screen STD Screening: Has symptoms, Is a contact Have you ever had an STD?: Yes History of Antibiotic use in the past 2 weeks?: No STD Symptoms Denies all: No Genital Itching: Yes Lower abdominal pain: No Discharge: Yes Dysuria: No Genital ulcer / lesion: No Rash: No Vaginal irritation: No Oral / Other skin ulcer: No Pain with sex: No Sore Throat: No Visual Changes: No Vaginal Bleeding: No Risk Factors for  Hep B Household, sexual, or needle sharing contact of a person infected with Hep B: No Sexual contact with a person who uses drugs not as prescribed?: No Currently or Ever used drugs not as prescribed: No HIV Positive: No PRep Patient: No Men who have sex with men: No Have Hepatitis C: No History of Incarceration: No History of Homeslessness?: No Anal sex following anal drug use?: No Risk Factors for Hep C Currently using drugs not as prescribed: No Sexual partner(s) currently using drugs as not prescribed: No History of drug use: No HIV Positive: No People with a history of incarceration: No People born between the years of 34 and 54: No Counseling Patient counseled to use condoms with all sex: Condoms declined RTC in 2-3 weeks for test results: Yes [name] will call if test results abnormal before test result appt.: Yes Immunizations: Referred, Immunization history assessed Test results given to patient Patient counseled to use condoms with all sex: Condoms declined   Screening for MPX risk:  Unexplained rash?  No   MSM?  No   Multiple or anonymous sex partners?  No   Any close or sexual contact with a person  diagnosed with MPX?  No   Any outside the [place]  where MPX is endemic?  No   High clinical suspicion for MPX?    -Unlikely to be chickenpox    -Lymphadenopathy    -Rash that presents in same phase of       evolution on any given body part  No   Does this patient meet CDC recommendations for vaccination against MPOX? No  You already have or  anticipate having the following risks:  Your sex partner has the following risks: You're traveling to a county with a clade I MPOX outbreak and anticipate these risks: Occupational exposure  You had known or suspected exposure to someone with monkeypox You had a sex partner in the past 2 weeks who was diagnosed with monkeypox You are a gay, bisexual, or other man who has sex with men, or are transgender or nonbinary and in the  past 6 months have had any of the following: - A new diagnosis of one or more sexually transmitted diseases (e.g., chlamydia, gonorrhea, or syphilis) - More than one sex partner You have had any of the following in the past 6 months: - Sex at a commercial sex venue (like a sex club or bathhouse) - Sex related to a large commercial event   or in a geographic area (city or county for example) where mpox virus transmission is occurring Sex with a new partner Sex at a commercial sex venue (e.g., a sex club or bathhouse) Sex in it consultant for money, goods, drugs, or other trade Sex in association with a large public event (e.g., a rave, party, or festival) i.e. certain people who work in a laboratory or healthcare facility   Infectious disease screenings: Vaccinated against HPV? Unknown  HIV Ever had a positive? No Last test: 2019 Results in chart:  Lab Results  Component Value Date   HMHIVSCREEN Negative - Validated 10/31/2018   No results found for: HIV   Hep B Hep B status: unknown or no prior testing Received HBV vaccination? Unknown Received HBV testing for immunity? Unknown Results in chart:  No components found for: HMHEPBSCREEN  Do they qualify for HBV screening today? No Criteria:  -Household, sexual or needle sharing contact with HBV -History of drug use or homelessness -HIV positive -Those with known Hep C  Hep C Hep C status: unknown or no prior testing Results in chart:  No results found for: HMHEPCSCREEN No components found for: HEPC  Do they qualify for HCV screening today? No Criteria - since the last HCV result, does the patient have any of the following? - Current drug use - Have a partner with drug use - Has been incarcerated  Immunization history:   There is no immunization history on file for this patient.  The following portions of the patient's history were reviewed and updated as appropriate: allergies, current medications, past medical  history, past social history, past surgical history and problem list.  Substance use screenings:  Uses tobacco products? No Uses vapes? Yes Uses alcohol? Yes Uses non-injectable substances that alter your mental status? No Uses non-prescribed injectable substances? No  Objective:  There were no vitals filed for this visit.  Physical Exam Vitals and nursing note reviewed. Exam conducted with a chaperone present Sandra [name]).  Constitutional:      Appearance: Normal appearance.  HENT:     Head: Normocephalic and atraumatic.     Comments: No nits or hair loss of scalp, brows, and lashes    Mouth/Throat:     Mouth: Mucous membranes are moist.     Pharynx: Oropharynx is clear. No oropharyngeal exudate or posterior oropharyngeal erythema.  Eyes:     General:        Right eye: No discharge.        Left eye: No discharge.     Conjunctiva/sclera: Conjunctivae normal.     Right eye: Right conjunctiva is not injected.     Left eye: Left  conjunctiva is not injected.  Pulmonary:     Effort: Pulmonary effort is normal.  Abdominal:     Tenderness: There is no abdominal tenderness. There is no rebound.  Genitourinary:    General: Normal vulva.     Exam position: Lithotomy position.     Pubic Area: No rash or pubic lice.      Labia:        Right: No rash or lesion.        Left: No rash or lesion.      Vagina: Vaginal discharge present. No erythema or lesions.     Cervix: No cervical motion tenderness, discharge, lesion or erythema.     Uterus: Not enlarged and not tender.      Rectum: Normal.     Comments: pH = 8 Lymphadenopathy:     Cervical: No cervical adenopathy.     Upper Body:     Right upper body: No supraclavicular or axillary adenopathy.     Left upper body: No supraclavicular or axillary adenopathy.  Skin:    General: Skin is warm and dry.     Findings: No lesion or rash.  Neurological:     Mental Status: She is alert and oriented to person, place, and time.     Assessment and Plan:  Sandra [name] is a 39 y.o. female presenting [facility] for STI screening.  Patient accepted the following screenings: vaginal CT/GC swab, vaginal wet prep, HIV, and RPR  1. Screening for venereal disease (Primary)  - WET PREP FOR TRICH, YEAST, CLUE - Chlamydia/Gonorrhea [facility] - HIV [facility] - Syphilis Serology, [facility]   2. Trichomonas infection  - metroNIDAZOLE  (FLAGYL ) 500 MG tablet; Take 1 tablet (500 mg total) by mouth 2 (two) times daily for 7 days.  - No contact cards given, 1 partner in the last 2 months and he notified her of his trichomonas infection, prompting her to come to [facility].  Counseling: Discussed time line for [facility] results and that patient will be called with positive results and encouraged patient to call if they had not heard in 2 weeks.  Counseled to return or seek care for continued or worsening symptoms Recommended repeat testing in 3 months with positive results. Recommended condom use with all sex for STI prevention.   Return in about 3 months (around 04/01/2025).  Future Appointments  Date Time Provider Department Center  01/03/2025  9:15 AM Sandra [name], Sandra [name] [facility] None   Damien FORBES Satchel, NP "

## 2025-01-02 ENCOUNTER — Ambulatory Visit: Payer: Self-pay | Admitting: Family Medicine

## 2025-01-03 ENCOUNTER — Other Ambulatory Visit (HOSPITAL_COMMUNITY)
Admission: RE | Admit: 2025-01-03 | Discharge: 2025-01-03 | Disposition: A | Source: Ambulatory Visit | Attending: Registered Nurse | Admitting: Registered Nurse

## 2025-01-03 ENCOUNTER — Encounter: Payer: Self-pay | Admitting: Registered Nurse

## 2025-01-03 ENCOUNTER — Ambulatory Visit (INDEPENDENT_AMBULATORY_CARE_PROVIDER_SITE_OTHER): Admitting: Registered Nurse

## 2025-01-03 VITALS — BP 108/74 | HR 82 | Wt 194.4 lb

## 2025-01-03 DIAGNOSIS — Z124 Encounter for screening for malignant neoplasm of cervix: Secondary | ICD-10-CM | POA: Diagnosis present

## 2025-01-03 DIAGNOSIS — Z01419 Encounter for gynecological examination (general) (routine) without abnormal findings: Secondary | ICD-10-CM | POA: Diagnosis present

## 2025-01-03 DIAGNOSIS — Z01411 Encounter for gynecological examination (general) (routine) with abnormal findings: Secondary | ICD-10-CM | POA: Diagnosis not present

## 2025-01-03 DIAGNOSIS — Z3042 Encounter for surveillance of injectable contraceptive: Secondary | ICD-10-CM | POA: Diagnosis not present

## 2025-01-03 DIAGNOSIS — N920 Excessive and frequent menstruation with regular cycle: Secondary | ICD-10-CM

## 2025-01-03 DIAGNOSIS — Z833 Family history of diabetes mellitus: Secondary | ICD-10-CM | POA: Diagnosis not present

## 2025-01-03 MED ORDER — MEDROXYPROGESTERONE ACETATE 150 MG/ML IM SUSP
150.0000 mg | Freq: Once | INTRAMUSCULAR | Status: AC
  Administered 2025-01-03: 150 mg via INTRAMUSCULAR

## 2025-01-03 NOTE — Progress Notes (Signed)
 "  ANNUAL GYNECOLOGICAL EXAM  HPI  Sandra [name] is a 39 y.o.-year-old G3P3 who presents for an annual gynecological exam today.  She denies pelvic pain, dyspareunia, abnormal vaginal bleeding or discharge, and UTI symptoms. [facility] She had STI testing at that time that was otherwise normal. She's had a BTL, but has heavy menstrual bleeding and would like to start Depo. She is no longer in contact with her sex partner who exposed her to trich. She is taking her medications without issue.  Medical/Surgical History Past Medical History:  Diagnosis Date   No pertinent past medical history    Past Surgical History:  Procedure Laterality Date   CESAREAN SECTION     hx 2 cesarean   TUBAL LIGATION      Social Drivers of Health   Tobacco Use: High Risk (01/03/2025)   Patient History    Smoking Tobacco Use: Every Day    Smokeless Tobacco Use: Never    Passive Exposure: Not on file  Financial Resource Strain: Not on file  Food Insecurity: Not on file  Transportation Needs: Not on file  Physical Activity: Not on file  Stress: Not on file  Social Connections: Not on file  Depression (PHQ2-9): Low Risk (01/03/2025)   Depression (PHQ2-9)    PHQ-2 Score: 0  Alcohol Screen: Not on file  Housing: Not on file  Utilities: Not on file  Health Literacy: Not on file     Obstetric History OB History     Gravida  3   Para  3   Term      Preterm      AB      Living  3      SAB      IAB      Ectopic      Multiple      Live Births               GYN/Menstrual History Patient's last menstrual period was 12/23/2024 (exact date). Regular cycles with heavy flow, intense cramping and clots Last Pap: Unsure  Contraception: BTL  Current Medications Show/hide medication list[1]      ROS Constitutional: Denied constitutional symptoms, night sweats, recent illness, fatigue, fever, insomnia and weight loss.   Eyes: Denied eye symptoms, eye pain, photophobia, vision change and visual disturbance.  Ears/Nose/Throat/Neck: Denied ear, nose, throat or neck symptoms, hearing loss, nasal discharge, sinus congestion and sore throat.  Cardiovascular: Denied cardiovascular symptoms, arrhythmia, chest pain/pressure, edema, exercise intolerance, orthopnea and palpitations.  Respiratory: Denied pulmonary symptoms, asthma, pleuritic pain, productive sputum, cough, dyspnea and wheezing.  Gastrointestinal: Denied gastro-esophageal reflux, melena, nausea and vomiting.  Genitourinary: Denied genitourinary symptoms including symptomatic vaginal discharge, pelvic relaxation issues, and urinary complaints.  Musculoskeletal: Denied musculoskeletal symptoms, stiffness, swelling, muscle weakness and myalgia.  Dermatologic: Denied dermatology symptoms, rash and scar.  Neurologic: Denied neurology symptoms, dizziness, headache, neck pain and syncope.  Psychiatric: Denied psychiatric symptoms, anxiety and depression.  Endocrine: Denied endocrine symptoms including hot flashes and night sweats.    OBJECTIVE  BP 108/74   Pulse 82   Wt 194 lb 6.4 oz (88.2 kg)   LMP 12/23/2024 (Exact Date)   BMI 29.56 kg/m    Physical examination General NAD, Conversant  HEENT Atraumatic; Op clear with mmm.  Normo-cephalic. Pupils reactive. Anicteric sclerae  Thyroid/Neck Smooth without nodularity or enlargement. Normal ROM.  Neck Supple.  Skin No rashes, lesions or ulceration. Normal palpated  skin turgor. No nodularity.  Breasts: No masses or discharge.  Symmetric.  No axillary adenopathy.  Lungs: Clear to auscultation.No rales or wheezes. Normal Respiratory effort, no retractions.  Heart: NSR.  No murmurs or rubs appreciated. No peripheral edema  Abdomen: Soft.  Non-tender.  No masses.  No HSM. No hernia  Extremities: Moves all appropriately.  Normal ROM for age. No lymphadenopathy.  Neuro: Oriented to PPT.  Normal mood. Normal  affect.     Pelvic:   Vulva: Normal appearance.  No lesions.  Vagina: No lesions or abnormalities noted.  Support: Normal pelvic support.  Urethra No masses tenderness or scarring.  Meatus Normal size without lesions or prolapse.  Cervix: Normal appearance.  No lesions.  Anus: Normal exam.  No lesions.  Perineum: Normal exam.  No lesions.    ASSESSMENT  1) Annual exam- grossly WNL 2) Menorrhagia 3) Cervical cancer screening 4) Family history diabetes 5) Current tx for trich  PLAN 1) Physical exam as noted. Discussed healthy lifestyle choices and preventive care. 2) Pap with cotesting today 3) Return in one month for TOC for trich 4) Hgb A1c for diabetes screening 5) Depo ordered for control of menorrhagia. We reviewed other options for management including progesterone IUD, OCPs, nuvaring. She declines all. We discussed increased risk of bone demineralization with prolonged depo use. Recommend daily calcium and vitamin D supplementation. She verbalizes understanding.      Lauraine Lakes, CNM     [1]  Outpatient Medications Prior to Visit  Medication Sig   metroNIDAZOLE  (FLAGYL ) 500 MG tablet Take 1 tablet (500 mg total) by mouth 2 (two) times daily for 7 days.   [DISCONTINUED] benzonatate  (TESSALON  PERLES) 100 MG capsule Take 1 capsule (100 mg total) by mouth 3 (three) times daily as needed for cough (Take 1-2 per dose).   No facility-administered medications prior to visit.   "

## 2025-01-04 LAB — HEMOGLOBIN A1C
Est. average glucose Bld gHb Est-mCnc: 123 mg/dL
Hgb A1c MFr Bld: 5.9 % — ABNORMAL HIGH (ref 4.8–5.6)

## 2025-01-07 ENCOUNTER — Ambulatory Visit: Payer: Self-pay | Admitting: Registered Nurse

## 2025-01-07 LAB — CYTOLOGY - PAP
Comment: NEGATIVE
Diagnosis: NEGATIVE
High risk HPV: NEGATIVE

## 2025-01-14 ENCOUNTER — Other Ambulatory Visit: Payer: Self-pay

## 2025-01-14 ENCOUNTER — Emergency Department: Admission: EM | Admit: 2025-01-14 | Discharge: 2025-01-14 | Disposition: A | Discharge status: Home / Self Care

## 2025-01-14 DIAGNOSIS — N898 Other specified noninflammatory disorders of vagina: Secondary | ICD-10-CM | POA: Insufficient documentation

## 2025-01-14 LAB — WET PREP, GENITAL
Clue Cells Wet Prep HPF POC: NONE SEEN
Sperm: NONE SEEN
Trich, Wet Prep: NONE SEEN
WBC, Wet Prep HPF POC: >=10 — AB
Yeast Wet Prep HPF POC: NONE SEEN

## 2025-01-14 LAB — URINALYSIS, ROUTINE W REFLEX MICROSCOPIC
Bilirubin Urine: NEGATIVE
Glucose, UA: NEGATIVE mg/dL
Ketones, ur: NEGATIVE mg/dL
Nitrite: NEGATIVE
Protein, ur: NEGATIVE mg/dL
Specific Gravity, Urine: 1.002 — ABNORMAL LOW (ref 1.005–1.030)
pH: 6 (ref 5.0–8.0)

## 2025-01-14 LAB — CHLAMYDIA/NGC RT PCR (ARMC ONLY)
Chlamydia Tr: NOT DETECTED
N gonorrhoeae: NOT DETECTED

## 2025-01-14 LAB — POC URINE PREG, ED: Preg Test, Ur: NEGATIVE

## 2025-01-14 NOTE — ED Provider Notes (Signed)
 "  [facility] Provider Note    Event Date/Time   First MD Initiated Contact with Patient 01/14/25 1843     (approximate)   History   Vaginal Discharge   HPI  Sandra [name] is a 39 y.o. female with PMH of trichomonas presents for evaluation of abnormal vaginal discharge.  Patient states that she had a checkup with her OB/GYN on 1/21.  She reports they did a Pap smear at that time and she tested positive for trichomonas.  She reports that she took the treatment for about 3 days and then stopped taking it.  Then after a few days she resumed taking the medication and completed all of the antibiotics.  Patient reports new abnormal vaginal discharge as well as some itching.  She denies pelvic pain, abdominal pain, fevers and urinary symptoms.      Physical Exam   Triage Vital Signs: ED Triage Vitals [01/14/25 1831]  Encounter Vitals Group     BP 126/81     Girls Systolic BP Percentile      Girls Diastolic BP Percentile      Boys Systolic BP Percentile      Boys Diastolic BP Percentile      Pulse Rate 87     Resp 20     Temp 98.5 F (36.9 C)     Temp Source Oral     SpO2 98 %     Weight      Height      Head Circumference      Peak Flow      Pain Score 0     Pain Loc      Pain Education      Exclude from Growth Chart     Most recent vital signs: Vitals:   01/14/25 1831 01/14/25 1847  BP: 126/81   Pulse: 87   Resp: 20   Temp: 98.5 F (36.9 C)   SpO2: 98% 98%   General: Awake, no distress.  CV:  Good peripheral perfusion.  RRR. Resp:  Normal effort.  CTAB. Abd:  No distention.  Soft, no tenderness to palpation. Other:  Pelvic exam deferred.   ED Results / Procedures / Treatments   Labs (all labs ordered are listed, but only abnormal results are displayed) Labs Reviewed  WET PREP, GENITAL - Abnormal; Notable for the following components:      Result Value   WBC, Wet Prep HPF POC >=10 (*)    All other components within normal  limits  URINALYSIS, ROUTINE W REFLEX MICROSCOPIC - Abnormal; Notable for the following components:   Color, Urine STRAW (*)    APPearance HAZY (*)    Specific Gravity, Urine 1.002 (*)    Hgb urine dipstick SMALL (*)    Leukocytes,Ua LARGE (*)    Bacteria, UA RARE (*)    All other components within normal limits  CHLAMYDIA/NGC RT PCR ([facility] ONLY)            URINE CULTURE  POC URINE PREG, ED     PROCEDURES:  Critical Care performed: No  Procedures   MEDICATIONS ORDERED IN ED: Medications - No data to display   IMPRESSION / MDM / ASSESSMENT AND PLAN / ED COURSE  I reviewed the triage vital signs and the nursing notes.                             39 year old female presents for evaluation  of abnormal vaginal discharge.  Vital signs are stable patient NAD on exam.  Differential diagnosis includes, but is not limited to, gonorrhea, chlamydia, yeast infection, trichomonas, bacterial vaginosis, UTI.  Patient's presentation is most consistent with acute complicated illness / injury requiring diagnostic workup.  Will obtain wet prep, GC swab and UA.  Wet prep is negative.  Gonorrhea chlamydia test is negative.  Urinalysis shows presence of some leukocytes and RBCs but no WBCs, does have some bacteria and squamous cells.  Do not suspect UTI as patient is asymptomatic but will send urine for culture to confirm.  Will not place patient on prophylactic antibiotics.  Patient was advised to follow-up with her OB/GYN.  She did not need a note for work.  She voiced understanding, all questions were answered and she was stable at discharge.      FINAL CLINICAL IMPRESSION(S) / ED DIAGNOSES   Final diagnoses:  Vaginal discharge     Rx / DC Orders   ED Discharge Orders     None        Note:  This document was prepared using Dragon voice recognition software and may include unintentional dictation errors.   Cleaster Tinnie LABOR, PA-C 01/14/25 2215    Fernand Rossie HERO,  MD 01/14/25 2317  "

## 2025-01-14 NOTE — ED Triage Notes (Signed)
 Pt to ED via POV from home. Pt reports recent dx of trich and did not wait before having unprotected sex again and is now having green discharge and itching.

## 2025-01-16 LAB — URINE CULTURE

## 2025-01-17 NOTE — Progress Notes (Signed)
 ED Antimicrobial Stewardship Positive Culture Follow Up   Sandra [name] is an 39 y.o. female who presented to [facility] on 01/14/2025 with a chief complaint of  Chief Complaint  Patient presents with   Vaginal Discharge    Recent Results (from the past 720 hours)  WET PREP FOR TRICH, YEAST, CLUE     Status: Abnormal   Collection Time: 01/01/25  3:23 PM  Result Value Ref Range Status   Trichomonas Exam Positive (A) Negative Final   Yeast Exam Negative Negative Final   Clue Cell Exam Negative Negative Final    Comment: AMINE: NEGATIVE  Chlamydia/NGC rt PCR ([facility] only)     Status: None   Collection Time: 01/14/25  6:33 PM   Specimen: Urine  Result Value Ref Range Status   Specimen source GC/Chlam THICK  Final   Chlamydia Tr NOT DETECTED NOT DETECTED Final   N gonorrhoeae NOT DETECTED NOT DETECTED Final    Comment: (NOTE) This CT/NG assay has not been evaluated in patients with a history of  hysterectomy. Performed at [facility], [address] [place], [place] [address]   Wet prep, genital     Status: Abnormal   Collection Time: 01/14/25  8:05 PM   Specimen: Urine, Clean Catch  Result Value Ref Range Status   Yeast Wet Prep HPF POC NONE SEEN NONE SEEN Final   Trich, Wet Prep NONE SEEN NONE SEEN Final   Clue Cells Wet Prep HPF POC NONE SEEN NONE SEEN Final   WBC, Wet Prep HPF POC >=10 (A) <10 Final   Sperm NONE SEEN  Final    Comment: Performed at [facility], [address], [place], [place] [address]  Urine Culture     Status: Abnormal   Collection Time: 01/14/25  8:05 PM   Specimen: Urine, Clean Catch  Result Value Ref Range Status   Specimen Description   Final    URINE, CLEAN CATCH Performed at [facility], [address], [place], [place] [address]    Special Requests   Final    NONE Performed at [facility], [address], [place], [place] [address]    Culture (A)  Final    CC2,000 COLONIES/ML GROUP B  STREP(S.AGALACTIAE)ISOLATED TESTING AGAINST S. AGALACTIAE NOT ROUTINELY PERFORMED DUE TO PREDICTABILITY OF AMP/PEN/VAN SUSCEPTIBILITY. <10,000 COLONIES/mL INSIGNIFICANT GROWTH Performed at [facility], [address] [place], [place] [address]    Report Status 01/16/2025 FINAL  Final    [x]  Patient discharged originally without antimicrobial agent   Patient discharged from [facility] ED on 2/5 without antibiotics. Prior to ED arrival, had complaints of vaginal discharge and itching. Patient had completed antibiotics for positive trichomoniasis on 1/21. At ED, vitals within normal limits, no fever, and no urinary symptoms.   Reached out to patient, and received verbal confirmation that patient has not experienced flank pain, fever, or urinary symptoms since ED discharge. Discussed case with ED provider, who did not recommend initiating antibiotic therapy.   New antibiotic prescription: none  ED Provider: Dr Willo Leonor JAYSON Viviana, PharmD Pharmacy Resident  01/17/2025 8:53 AM

## 2025-02-04 ENCOUNTER — Ambulatory Visit: Admitting: Registered Nurse
